# Patient Record
Sex: Female | Born: 1945
Health system: Southern US, Community
[De-identification: ages and names within clinical notes are randomized; demographics above are authoritative.]

## PROBLEM LIST (undated history)

## (undated) DIAGNOSIS — F429 Obsessive-compulsive disorder, unspecified: Secondary | ICD-10-CM

## (undated) DIAGNOSIS — E785 Hyperlipidemia, unspecified: Secondary | ICD-10-CM

## (undated) DIAGNOSIS — C439 Malignant melanoma of skin, unspecified: Secondary | ICD-10-CM

## (undated) DIAGNOSIS — C801 Malignant (primary) neoplasm, unspecified: Secondary | ICD-10-CM

## (undated) DIAGNOSIS — F419 Anxiety disorder, unspecified: Secondary | ICD-10-CM

## (undated) DIAGNOSIS — E119 Type 2 diabetes mellitus without complications: Secondary | ICD-10-CM

## (undated) HISTORY — PX: EYE SURGERY: SHX253

## (undated) HISTORY — DX: Hyperlipidemia, unspecified: E78.5

## (undated) HISTORY — DX: Obsessive-compulsive disorder, unspecified: F42.9

## (undated) HISTORY — DX: Anxiety disorder, unspecified: F41.9

## (undated) HISTORY — DX: Malignant melanoma of skin, unspecified: C43.9

## (undated) HISTORY — DX: Type 2 diabetes mellitus without complications: E11.9

## (undated) HISTORY — PX: ABDOMINAL HYSTERECTOMY: SHX81

## (undated) HISTORY — DX: Malignant (primary) neoplasm, unspecified: C80.1

## (undated) HISTORY — PX: CATARACT EXTRACTION: SUR2

---

## 1998-05-21 ENCOUNTER — Ambulatory Visit (HOSPITAL_COMMUNITY): Admission: RE | Admit: 1998-05-21 | Discharge: 1998-05-21 | Payer: Self-pay | Admitting: Internal Medicine

## 1998-06-19 ENCOUNTER — Other Ambulatory Visit: Admission: RE | Admit: 1998-06-19 | Discharge: 1998-06-19 | Payer: Self-pay | Admitting: Gastroenterology

## 2000-02-16 ENCOUNTER — Encounter: Admission: RE | Admit: 2000-02-16 | Discharge: 2000-02-16 | Payer: Self-pay | Admitting: Internal Medicine

## 2000-02-16 ENCOUNTER — Encounter: Payer: Self-pay | Admitting: Internal Medicine

## 2000-03-26 ENCOUNTER — Encounter: Payer: Self-pay | Admitting: Internal Medicine

## 2000-03-26 ENCOUNTER — Encounter: Admission: RE | Admit: 2000-03-26 | Discharge: 2000-03-26 | Payer: Self-pay | Admitting: Internal Medicine

## 2001-02-17 ENCOUNTER — Encounter: Payer: Self-pay | Admitting: Internal Medicine

## 2001-02-17 ENCOUNTER — Encounter: Admission: RE | Admit: 2001-02-17 | Discharge: 2001-02-17 | Payer: Self-pay | Admitting: Internal Medicine

## 2001-02-21 ENCOUNTER — Encounter: Payer: Self-pay | Admitting: Internal Medicine

## 2001-02-21 ENCOUNTER — Encounter: Admission: RE | Admit: 2001-02-21 | Discharge: 2001-02-21 | Payer: Self-pay | Admitting: Internal Medicine

## 2002-02-20 ENCOUNTER — Encounter: Payer: Self-pay | Admitting: Internal Medicine

## 2002-02-20 ENCOUNTER — Encounter: Admission: RE | Admit: 2002-02-20 | Discharge: 2002-02-20 | Payer: Self-pay | Admitting: Internal Medicine

## 2003-02-23 ENCOUNTER — Encounter: Admission: RE | Admit: 2003-02-23 | Discharge: 2003-02-23 | Payer: Self-pay | Admitting: Internal Medicine

## 2003-02-23 ENCOUNTER — Encounter: Payer: Self-pay | Admitting: Internal Medicine

## 2004-01-22 ENCOUNTER — Encounter: Admission: RE | Admit: 2004-01-22 | Discharge: 2004-01-22 | Payer: Self-pay | Admitting: Internal Medicine

## 2005-01-22 ENCOUNTER — Encounter: Admission: RE | Admit: 2005-01-22 | Discharge: 2005-01-22 | Payer: Self-pay | Admitting: Internal Medicine

## 2006-01-14 ENCOUNTER — Encounter: Admission: RE | Admit: 2006-01-14 | Discharge: 2006-01-14 | Payer: Self-pay | Admitting: Internal Medicine

## 2007-01-24 ENCOUNTER — Encounter: Admission: RE | Admit: 2007-01-24 | Discharge: 2007-01-24 | Payer: Self-pay | Admitting: Internal Medicine

## 2008-01-24 ENCOUNTER — Encounter: Admission: RE | Admit: 2008-01-24 | Discharge: 2008-01-24 | Payer: Self-pay | Admitting: Internal Medicine

## 2009-01-24 ENCOUNTER — Encounter: Admission: RE | Admit: 2009-01-24 | Discharge: 2009-01-24 | Payer: Self-pay | Admitting: Internal Medicine

## 2010-01-24 ENCOUNTER — Encounter: Admission: RE | Admit: 2010-01-24 | Discharge: 2010-01-24 | Payer: Self-pay | Admitting: Internal Medicine

## 2010-02-25 ENCOUNTER — Encounter (INDEPENDENT_AMBULATORY_CARE_PROVIDER_SITE_OTHER): Payer: Self-pay | Admitting: *Deleted

## 2010-02-28 ENCOUNTER — Encounter (INDEPENDENT_AMBULATORY_CARE_PROVIDER_SITE_OTHER): Payer: Self-pay | Admitting: *Deleted

## 2010-02-28 ENCOUNTER — Ambulatory Visit: Payer: Self-pay | Admitting: Gastroenterology

## 2010-03-18 ENCOUNTER — Ambulatory Visit: Payer: Self-pay | Admitting: Gastroenterology

## 2010-03-19 ENCOUNTER — Encounter: Payer: Self-pay | Admitting: Gastroenterology

## 2010-11-11 NOTE — Letter (Signed)
Summary: [facility] Instructions  [facility]  [address] [place], [place] [address]   Phone: [phone]  Fax: [phone]       Diane Huff    Dec 02, 1945    MRN: [id number]        Procedure Day Dorna Bloom:  [name]  03/18/10     Arrival Time: 8:00am     Procedure Time:  9:00am    Location of Procedure:                    [name]  [facility] (4th Floor)                       PREPARATION FOR COLONOSCOPY WITH MOVIPREP   Starting 5 days prior to your procedure  THURSDAY 06/02  do not eat nuts, seeds, popcorn, corn, beans, peas,  salads, or any raw vegetables.  Do not take any fiber supplements (e.g. Metamucil, Citrucel, and Benefiber).  THE DAY BEFORE YOUR PROCEDURE         DATE: MONDAY  06/06  1.  Drink clear liquids the entire day-NO SOLID FOOD  2.  Do not drink anything colored red or purple.  Avoid juices with pulp.  No orange juice.  3.  Drink at least 64 oz. (8 glasses) of fluid/clear liquids during the day to prevent dehydration and help the prep work efficiently.  CLEAR LIQUIDS INCLUDE: Water Jello Ice Popsicles Tea (sugar ok, no milk/cream) Powdered fruit flavored drinks Coffee (sugar ok, no milk/cream) Gatorade Juice: apple, white grape, white cranberry  Lemonade Clear bullion, consomm, broth Carbonated beverages (any kind) Strained chicken noodle soup Hard Candy                             4.  In the morning, mix first dose of MoviPrep solution:    Empty 1 Pouch A and 1 Pouch B into the disposable container    Add lukewarm drinking water to the top line of the container. Mix to dissolve    Refrigerate (mixed solution should be used within 24 hrs)  5.  Begin drinking the prep at 5:00 p.m. The MoviPrep container is divided by 4 marks.   Every 15 minutes drink the solution down to the next mark (approximately 8 oz) until the full liter is complete.   6.  Follow completed prep with 16 oz of clear liquid of your choice  (Nothing red or purple).  Continue to drink clear liquids until bedtime.  7.  Before going to bed, mix second dose of MoviPrep solution:    Empty 1 Pouch A and 1 Pouch B into the disposable container    Add lukewarm drinking water to the top line of the container. Mix to dissolve    Refrigerate  THE DAY OF YOUR PROCEDURE      DATE: TUESDAY  06/07  Beginning at  4:00 a.m. (5 hours before procedure):         1. Every 15 minutes, drink the solution down to the next mark (approx 8 oz) until the full liter is complete.  2. Follow completed prep with 16 oz. of clear liquid of your choice.    3. You may drink clear liquids until  7:00am (2 HOURS BEFORE PROCEDURE).   MEDICATION INSTRUCTIONS  Unless otherwise instructed, you should take regular prescription medications with a small sip of water   as early as possible the  morning of your procedure.  Diabetic patients - see separate instructions.   Additional medication instructions: n/a         OTHER INSTRUCTIONS  You will need a responsible adult at least 66 years of age to accompany you and drive you home.   This person must remain in the waiting room during your procedure.  Wear loose fitting clothing that is easily removed.  Leave jewelry and other valuables at home.  However, you may wish to bring a book to read or  an iPod/MP3 player to listen to music as you wait for your procedure to start.  Remove all body piercing jewelry and leave at home.  Total time from sign-in until discharge is approximately 2-3 hours.  You should go home directly after your procedure and rest.  You can resume normal activities the  day after your procedure.  The day of your procedure you should not:   Drive   Make legal decisions   Operate machinery   Drink alcohol   Return to work  You will receive specific instructions about eating, activities and medications before you leave.    The above instructions have been reviewed  and explained to me by   Sherren Kerns RN  Feb 28, 2010 8:28 AM    I fully understand and can verbalize these instructions _____________________________ Date _________

## 2010-11-11 NOTE — Letter (Signed)
Summary: Diabetic Instructions  [facility]  [address] [place], [place] [address]   Phone: [phone]  Fax: [phone]    Diane Huff 1946/02/08 MRN: [id number]   _x _   ORAL DIABETIC MEDICATION INSTRUCTIONS  The day before your procedure:   Take your diabetic pill as you do normally  The day of your procedure:   Do not take your diabetic pill    We will check your blood sugar levels during the admission process and again in Recovery before discharging you home  ________________________________________________________________________

## 2010-11-11 NOTE — Procedures (Signed)
Summary: Colonoscopy  Patient: Diane Huff Note: All result statuses are Final unless otherwise noted.  Tests: (1) Colonoscopy (COL)   COL Colonoscopy           DONE     [facility]     [address]     [place], [place]  [address]           COLONOSCOPY PROCEDURE REPORT           PATIENT:  [name], [name]  MR#:  [id number]     BIRTHDATE:  [birth year], 64 yrs. old  GENDER:  female     ENDOSCOPIST:  Judie Petit T. Russella Dar, MD, [facility]           PROCEDURE DATE:  03/18/2010     PROCEDURE:  Colonoscopy with snare polypectomy     ASA CLASS:  Class II     INDICATIONS:  1) follow-up of polyp  2) surveillance and high-risk     screening, adenomatous polyp, 06/1998 and 04/2002.     MEDICATIONS:   Fentanyl 75 mcg IV, Versed 7 mg IV     DESCRIPTION OF PROCEDURE:   After the risks benefits and     alternatives of the procedure were thoroughly explained, informed     consent was obtained.  Digital rectal exam was performed and     revealed no abnormalities.   The LB [id number]-[id number] endoscope     was introduced through the anus and advanced to the cecum, which     was identified by both the appendix and ileocecal valve, without     limitations.  The quality of the prep was good, using MoviPrep.     The instrument was then slowly withdrawn as the colon was fully     examined.     <<PROCEDUREIMAGES>>     FINDINGS:  A sessile polyp was found at the hepatic flexure. It     was 5 mm in size. Polyp was snared without cautery. Retrieval was     successful. A sessile polyp was found in the descending colon. It     was 5 mm in size. Polyp was snared without cautery. Retrieval was     successful. A sessile polyp was found in the right colon. It was 6     mm in size. Polyp was snared without cautery. Retrieval was     successful. Mild diverticulosis was found in the sigmoid colon.     This was otherwise a normal examination of the colon. Retroflexed     views in the rectum revealed no  abnormalities. The time to cecum =     2.33  minutes. The scope was then withdrawn (time =  8.75  min)     from the patient and the procedure completed.           COMPLICATIONS:  None           ENDOSCOPIC IMPRESSION:     1) 5 mm sessile polyp at the hepatic flexure     2) 5 mm sessile polyp in the descending colon     3) 6 mm sessile polyp in the right colon     4) Mild diverticulosis in the sigmoid colon           RECOMMENDATIONS:     1) Await pathology results     2) High fiber diet with liberal fluid intake.     3) Repeat Colonoscopy in 5 years.  Venita Lick. Russella Dar, MD, [facility]           CC: Nila Nephew, MD           n.     Rosalie DoctorVenita Lick. Shantika Bermea at 03/18/2010 09:35 AM           Arn Medal, [id number]  Note: An exclamation mark (!) indicates a result that was not dispersed into the flowsheet. Document Creation Date: 03/18/2010 9:36 AM _______________________________________________________________________  (1) Order result status: Final Collection or observation date-time: 03/18/2010 09:31 Requested date-time:  Receipt date-time:  Reported date-time:  Referring Physician:   Ordering Physician: Claudette Head [phone]) Specimen Source:  Source: Launa Grill Order Number: [id number] Lab site:   Appended Document: Colonoscopy     Procedures Next Due Date:    Colonoscopy: 03/2015

## 2010-11-11 NOTE — Letter (Signed)
Summary: Patient Notice- Polyp Results  [facility]  [address] [place], [place] [address]   Phone: [phone]  Fax: [phone]        March 19, 2010 MRN: [id number]    Diane Huff [address] [place], [place]  [address]    Dear Ms. Pica,  I am pleased to inform you that the colon polyp(s) removed during your recent colonoscopy was (were) found to be benign (no cancer detected) upon pathologic examination.  I recommend you have a repeat colonoscopy examination in 5 years to look for recurrent polyps, as having colon polyps increases your risk for having recurrent polyps or even colon cancer in the future.  Should you develop new or worsening symptoms of abdominal pain, bowel habit changes or bleeding from the rectum or bowels, please schedule an evaluation with either your primary care physician or with me.  Continue treatment plan as outlined the day of your exam.  Please call us if you are having persistent problems or have questions about your condition that have not been fully answered at this time.  Sincerely,  Meryl Dare MD [facility]  This letter has been electronically signed by your physician.  Appended Document: Patient Notice- Polyp Results letter mailed.

## 2010-11-11 NOTE — Miscellaneous (Signed)
Summary: previsit/rm  Clinical Lists Changes  Medications: Added new medication of MOVIPREP 100 GM  SOLR (PEG-KCL-NACL-NASULF-NA ASC-C) As per prep instructions. - Signed Rx of MOVIPREP 100 GM  SOLR (PEG-KCL-NACL-NASULF-NA ASC-C) As per prep instructions.;  #1 x 0;  Signed;  Entered by: Sherren Kerns RN;  Authorized by: Meryl Dare MD [facility];  Method used: Electronically to [facility] # [id number]*, [address] [place], [place], [place]  [address], Ph: [phone] or [phone], Fax: [phone] Allergies: Added new allergy or adverse reaction of SULFA Observations: Added new observation of NKA: F (02/28/2010 8:01)    Prescriptions: MOVIPREP 100 GM  SOLR (PEG-KCL-NACL-NASULF-NA ASC-C) As per prep instructions.  #1 x 0   Entered by:   Sherren Kerns RN   Authorized by:   Meryl Dare MD [facility]   Signed by:   Sherren Kerns RN on 02/28/2010   Method used:   Electronically to        [facility] # [id number]* (retail)       [address]       [place], [place]  [address]       Ph: [phone] or [phone]       Fax: [phone]   RxID:   [id number]

## 2010-11-27 ENCOUNTER — Other Ambulatory Visit: Payer: Self-pay | Admitting: Internal Medicine

## 2010-11-27 DIAGNOSIS — Z1231 Encounter for screening mammogram for malignant neoplasm of breast: Secondary | ICD-10-CM

## 2010-12-29 LAB — GLUCOSE, CAPILLARY

## 2011-01-27 ENCOUNTER — Ambulatory Visit
Admission: RE | Admit: 2011-01-27 | Discharge: 2011-01-27 | Disposition: A | Discharge status: Home / Self Care | Payer: Medicare Other | Source: Ambulatory Visit | Attending: Internal Medicine | Admitting: Internal Medicine

## 2011-01-27 DIAGNOSIS — Z1231 Encounter for screening mammogram for malignant neoplasm of breast: Secondary | ICD-10-CM

## 2011-12-22 ENCOUNTER — Other Ambulatory Visit: Payer: Self-pay | Admitting: Internal Medicine

## 2011-12-22 DIAGNOSIS — Z1231 Encounter for screening mammogram for malignant neoplasm of breast: Secondary | ICD-10-CM

## 2011-12-31 DIAGNOSIS — B353 Tinea pedis: Secondary | ICD-10-CM | POA: Diagnosis not present

## 2012-01-28 ENCOUNTER — Ambulatory Visit: Payer: Medicare Other

## 2012-01-28 ENCOUNTER — Ambulatory Visit
Admission: RE | Admit: 2012-01-28 | Discharge: 2012-01-28 | Disposition: A | Discharge status: Home / Self Care | Payer: Medicare Other | Source: Ambulatory Visit | Attending: Internal Medicine | Admitting: Internal Medicine

## 2012-01-28 DIAGNOSIS — Z1231 Encounter for screening mammogram for malignant neoplasm of breast: Secondary | ICD-10-CM

## 2012-02-05 ENCOUNTER — Ambulatory Visit: Payer: Medicare Other

## 2012-02-05 DIAGNOSIS — E039 Hypothyroidism, unspecified: Secondary | ICD-10-CM | POA: Diagnosis not present

## 2012-02-05 DIAGNOSIS — Z79899 Other long term (current) drug therapy: Secondary | ICD-10-CM | POA: Diagnosis not present

## 2012-02-05 DIAGNOSIS — E119 Type 2 diabetes mellitus without complications: Secondary | ICD-10-CM | POA: Diagnosis not present

## 2012-02-05 DIAGNOSIS — D045 Carcinoma in situ of skin of trunk: Secondary | ICD-10-CM | POA: Diagnosis not present

## 2012-02-05 DIAGNOSIS — Z0000 Encounter for general adult medical examination without abnormal findings: Secondary | ICD-10-CM | POA: Diagnosis not present

## 2012-02-10 ENCOUNTER — Other Ambulatory Visit: Payer: Self-pay | Admitting: Internal Medicine

## 2012-02-10 DIAGNOSIS — N959 Unspecified menopausal and perimenopausal disorder: Secondary | ICD-10-CM

## 2012-02-11 DIAGNOSIS — H35379 Puckering of macula, unspecified eye: Secondary | ICD-10-CM | POA: Diagnosis not present

## 2012-02-11 DIAGNOSIS — Z961 Presence of intraocular lens: Secondary | ICD-10-CM | POA: Diagnosis not present

## 2012-02-11 DIAGNOSIS — E119 Type 2 diabetes mellitus without complications: Secondary | ICD-10-CM | POA: Diagnosis not present

## 2012-02-11 DIAGNOSIS — H2510 Age-related nuclear cataract, unspecified eye: Secondary | ICD-10-CM | POA: Diagnosis not present

## 2012-02-11 DIAGNOSIS — C6930 Malignant neoplasm of unspecified choroid: Secondary | ICD-10-CM | POA: Diagnosis not present

## 2012-02-16 ENCOUNTER — Ambulatory Visit
Admission: RE | Admit: 2012-02-16 | Discharge: 2012-02-16 | Disposition: A | Discharge status: Home / Self Care | Payer: Medicare Other | Source: Ambulatory Visit | Attending: Internal Medicine | Admitting: Internal Medicine

## 2012-02-16 DIAGNOSIS — Z780 Asymptomatic menopausal state: Secondary | ICD-10-CM | POA: Diagnosis not present

## 2012-02-16 DIAGNOSIS — N959 Unspecified menopausal and perimenopausal disorder: Secondary | ICD-10-CM

## 2012-02-16 DIAGNOSIS — Z13820 Encounter for screening for osteoporosis: Secondary | ICD-10-CM | POA: Diagnosis not present

## 2012-02-18 ENCOUNTER — Other Ambulatory Visit: Payer: Medicare Other

## 2012-04-15 DIAGNOSIS — R234 Changes in skin texture: Secondary | ICD-10-CM | POA: Diagnosis not present

## 2012-04-15 DIAGNOSIS — M7981 Nontraumatic hematoma of soft tissue: Secondary | ICD-10-CM | POA: Diagnosis not present

## 2012-05-06 DIAGNOSIS — E119 Type 2 diabetes mellitus without complications: Secondary | ICD-10-CM | POA: Diagnosis not present

## 2012-05-06 DIAGNOSIS — F411 Generalized anxiety disorder: Secondary | ICD-10-CM | POA: Diagnosis not present

## 2012-07-14 DIAGNOSIS — E119 Type 2 diabetes mellitus without complications: Secondary | ICD-10-CM | POA: Diagnosis not present

## 2012-07-14 DIAGNOSIS — Z23 Encounter for immunization: Secondary | ICD-10-CM | POA: Diagnosis not present

## 2012-07-14 DIAGNOSIS — F411 Generalized anxiety disorder: Secondary | ICD-10-CM | POA: Diagnosis not present

## 2012-08-10 DIAGNOSIS — Z9849 Cataract extraction status, unspecified eye: Secondary | ICD-10-CM | POA: Diagnosis not present

## 2012-08-10 DIAGNOSIS — E119 Type 2 diabetes mellitus without complications: Secondary | ICD-10-CM | POA: Diagnosis not present

## 2012-08-10 DIAGNOSIS — H2640 Unspecified secondary cataract: Secondary | ICD-10-CM | POA: Diagnosis not present

## 2012-08-10 DIAGNOSIS — Z961 Presence of intraocular lens: Secondary | ICD-10-CM | POA: Diagnosis not present

## 2012-08-10 DIAGNOSIS — C6930 Malignant neoplasm of unspecified choroid: Secondary | ICD-10-CM | POA: Diagnosis not present

## 2012-08-10 DIAGNOSIS — H35379 Puckering of macula, unspecified eye: Secondary | ICD-10-CM | POA: Diagnosis not present

## 2012-08-10 DIAGNOSIS — H2589 Other age-related cataract: Secondary | ICD-10-CM | POA: Diagnosis not present

## 2012-08-15 DIAGNOSIS — F411 Generalized anxiety disorder: Secondary | ICD-10-CM | POA: Diagnosis not present

## 2012-09-14 DIAGNOSIS — L570 Actinic keratosis: Secondary | ICD-10-CM | POA: Diagnosis not present

## 2012-11-16 ENCOUNTER — Other Ambulatory Visit: Payer: Self-pay | Admitting: Internal Medicine

## 2012-11-16 DIAGNOSIS — Z1231 Encounter for screening mammogram for malignant neoplasm of breast: Secondary | ICD-10-CM

## 2013-01-12 DIAGNOSIS — C439 Malignant melanoma of skin, unspecified: Secondary | ICD-10-CM | POA: Diagnosis not present

## 2013-01-13 DIAGNOSIS — Z961 Presence of intraocular lens: Secondary | ICD-10-CM | POA: Diagnosis not present

## 2013-01-13 DIAGNOSIS — E119 Type 2 diabetes mellitus without complications: Secondary | ICD-10-CM | POA: Diagnosis not present

## 2013-01-13 DIAGNOSIS — C6930 Malignant neoplasm of unspecified choroid: Secondary | ICD-10-CM | POA: Diagnosis not present

## 2013-01-13 DIAGNOSIS — H35379 Puckering of macula, unspecified eye: Secondary | ICD-10-CM | POA: Diagnosis not present

## 2013-01-13 DIAGNOSIS — H2510 Age-related nuclear cataract, unspecified eye: Secondary | ICD-10-CM | POA: Diagnosis not present

## 2013-01-30 ENCOUNTER — Ambulatory Visit: Payer: Medicare Other

## 2013-01-30 ENCOUNTER — Ambulatory Visit
Admission: RE | Admit: 2013-01-30 | Discharge: 2013-01-30 | Disposition: A | Discharge status: Home / Self Care | Payer: Medicare Other | Source: Ambulatory Visit | Attending: Internal Medicine | Admitting: Internal Medicine

## 2013-01-30 DIAGNOSIS — Z1231 Encounter for screening mammogram for malignant neoplasm of breast: Secondary | ICD-10-CM

## 2013-02-16 DIAGNOSIS — E119 Type 2 diabetes mellitus without complications: Secondary | ICD-10-CM | POA: Diagnosis not present

## 2013-02-16 DIAGNOSIS — I10 Essential (primary) hypertension: Secondary | ICD-10-CM | POA: Diagnosis not present

## 2013-02-16 DIAGNOSIS — Z0000 Encounter for general adult medical examination without abnormal findings: Secondary | ICD-10-CM | POA: Diagnosis not present

## 2013-02-16 DIAGNOSIS — E7800 Pure hypercholesterolemia, unspecified: Secondary | ICD-10-CM | POA: Diagnosis not present

## 2013-02-16 DIAGNOSIS — E039 Hypothyroidism, unspecified: Secondary | ICD-10-CM | POA: Diagnosis not present

## 2013-03-30 DIAGNOSIS — F411 Generalized anxiety disorder: Secondary | ICD-10-CM | POA: Diagnosis not present

## 2013-04-06 DIAGNOSIS — F411 Generalized anxiety disorder: Secondary | ICD-10-CM | POA: Diagnosis not present

## 2013-04-06 DIAGNOSIS — L509 Urticaria, unspecified: Secondary | ICD-10-CM | POA: Diagnosis not present

## 2013-04-10 DIAGNOSIS — F411 Generalized anxiety disorder: Secondary | ICD-10-CM | POA: Diagnosis not present

## 2013-04-10 DIAGNOSIS — L509 Urticaria, unspecified: Secondary | ICD-10-CM | POA: Diagnosis not present

## 2013-04-18 DIAGNOSIS — E119 Type 2 diabetes mellitus without complications: Secondary | ICD-10-CM | POA: Diagnosis not present

## 2013-04-18 DIAGNOSIS — F329 Major depressive disorder, single episode, unspecified: Secondary | ICD-10-CM | POA: Diagnosis not present

## 2013-04-20 DIAGNOSIS — F411 Generalized anxiety disorder: Secondary | ICD-10-CM | POA: Diagnosis not present

## 2013-04-25 DIAGNOSIS — F411 Generalized anxiety disorder: Secondary | ICD-10-CM | POA: Diagnosis not present

## 2013-05-15 DIAGNOSIS — D1739 Benign lipomatous neoplasm of skin and subcutaneous tissue of other sites: Secondary | ICD-10-CM | POA: Diagnosis not present

## 2013-05-17 DIAGNOSIS — F411 Generalized anxiety disorder: Secondary | ICD-10-CM | POA: Diagnosis not present

## 2013-05-25 DIAGNOSIS — F411 Generalized anxiety disorder: Secondary | ICD-10-CM | POA: Diagnosis not present

## 2013-06-21 DIAGNOSIS — F411 Generalized anxiety disorder: Secondary | ICD-10-CM | POA: Diagnosis not present

## 2013-07-03 ENCOUNTER — Other Ambulatory Visit: Payer: Self-pay | Admitting: Dermatology

## 2013-07-03 DIAGNOSIS — Z85820 Personal history of malignant melanoma of skin: Secondary | ICD-10-CM | POA: Diagnosis not present

## 2013-07-03 DIAGNOSIS — L723 Sebaceous cyst: Secondary | ICD-10-CM | POA: Diagnosis not present

## 2013-07-06 DIAGNOSIS — F411 Generalized anxiety disorder: Secondary | ICD-10-CM | POA: Diagnosis not present

## 2013-07-19 DIAGNOSIS — F411 Generalized anxiety disorder: Secondary | ICD-10-CM | POA: Diagnosis not present

## 2013-08-01 DIAGNOSIS — F411 Generalized anxiety disorder: Secondary | ICD-10-CM | POA: Diagnosis not present

## 2013-08-10 DIAGNOSIS — E119 Type 2 diabetes mellitus without complications: Secondary | ICD-10-CM | POA: Diagnosis not present

## 2013-08-10 DIAGNOSIS — F411 Generalized anxiety disorder: Secondary | ICD-10-CM | POA: Diagnosis not present

## 2013-08-10 DIAGNOSIS — Z23 Encounter for immunization: Secondary | ICD-10-CM | POA: Diagnosis not present

## 2013-08-16 DIAGNOSIS — C6930 Malignant neoplasm of unspecified choroid: Secondary | ICD-10-CM | POA: Diagnosis not present

## 2013-08-16 DIAGNOSIS — Z961 Presence of intraocular lens: Secondary | ICD-10-CM | POA: Diagnosis not present

## 2013-08-16 DIAGNOSIS — H2589 Other age-related cataract: Secondary | ICD-10-CM | POA: Diagnosis not present

## 2013-08-16 DIAGNOSIS — H35379 Puckering of macula, unspecified eye: Secondary | ICD-10-CM | POA: Diagnosis not present

## 2013-08-16 DIAGNOSIS — E119 Type 2 diabetes mellitus without complications: Secondary | ICD-10-CM | POA: Diagnosis not present

## 2013-09-12 DIAGNOSIS — F411 Generalized anxiety disorder: Secondary | ICD-10-CM | POA: Diagnosis not present

## 2013-11-21 DIAGNOSIS — IMO0002 Reserved for concepts with insufficient information to code with codable children: Secondary | ICD-10-CM | POA: Diagnosis not present

## 2013-12-12 ENCOUNTER — Other Ambulatory Visit: Payer: Self-pay

## 2013-12-12 DIAGNOSIS — Z1231 Encounter for screening mammogram for malignant neoplasm of breast: Secondary | ICD-10-CM

## 2013-12-12 DIAGNOSIS — F411 Generalized anxiety disorder: Secondary | ICD-10-CM | POA: Diagnosis not present

## 2014-01-04 DIAGNOSIS — F411 Generalized anxiety disorder: Secondary | ICD-10-CM | POA: Diagnosis not present

## 2014-01-16 DIAGNOSIS — Z961 Presence of intraocular lens: Secondary | ICD-10-CM | POA: Diagnosis not present

## 2014-01-16 DIAGNOSIS — H2510 Age-related nuclear cataract, unspecified eye: Secondary | ICD-10-CM | POA: Diagnosis not present

## 2014-01-16 DIAGNOSIS — C6930 Malignant neoplasm of unspecified choroid: Secondary | ICD-10-CM | POA: Diagnosis not present

## 2014-01-16 DIAGNOSIS — H35379 Puckering of macula, unspecified eye: Secondary | ICD-10-CM | POA: Diagnosis not present

## 2014-01-30 ENCOUNTER — Encounter (INDEPENDENT_AMBULATORY_CARE_PROVIDER_SITE_OTHER): Payer: Self-pay

## 2014-01-30 ENCOUNTER — Ambulatory Visit
Admission: RE | Admit: 2014-01-30 | Discharge: 2014-01-30 | Disposition: A | Discharge status: Home / Self Care | Payer: Medicare Other | Source: Ambulatory Visit

## 2014-01-30 DIAGNOSIS — Z1231 Encounter for screening mammogram for malignant neoplasm of breast: Secondary | ICD-10-CM | POA: Diagnosis not present

## 2014-02-13 DIAGNOSIS — E119 Type 2 diabetes mellitus without complications: Secondary | ICD-10-CM | POA: Diagnosis not present

## 2014-02-13 DIAGNOSIS — Z79899 Other long term (current) drug therapy: Secondary | ICD-10-CM | POA: Diagnosis not present

## 2014-02-13 DIAGNOSIS — I119 Hypertensive heart disease without heart failure: Secondary | ICD-10-CM | POA: Diagnosis not present

## 2014-02-13 DIAGNOSIS — E039 Hypothyroidism, unspecified: Secondary | ICD-10-CM | POA: Diagnosis not present

## 2014-02-13 DIAGNOSIS — E7800 Pure hypercholesterolemia, unspecified: Secondary | ICD-10-CM | POA: Diagnosis not present

## 2014-02-16 ENCOUNTER — Ambulatory Visit (INDEPENDENT_AMBULATORY_CARE_PROVIDER_SITE_OTHER): Payer: Medicare Other | Admitting: Family Medicine

## 2014-02-16 ENCOUNTER — Ambulatory Visit: Payer: Medicare Other

## 2014-02-16 VITALS — BP 124/72 | HR 89 | Temp 98.0°F | Resp 14 | Ht 64.5 in | Wt 148.4 lb

## 2014-02-16 DIAGNOSIS — S40029A Contusion of unspecified upper arm, initial encounter: Secondary | ICD-10-CM

## 2014-02-16 DIAGNOSIS — I82890 Acute embolism and thrombosis of other specified veins: Secondary | ICD-10-CM | POA: Diagnosis not present

## 2014-02-16 DIAGNOSIS — S40021A Contusion of right upper arm, initial encounter: Secondary | ICD-10-CM

## 2014-02-16 DIAGNOSIS — R58 Hemorrhage, not elsewhere classified: Secondary | ICD-10-CM

## 2014-02-16 NOTE — Patient Instructions (Signed)
No broken bone was seen.  Ice to area if needed. Tylenol if needed for pain.  Return to the clinic or go to the nearest emergency room if any of your symptoms worsen or new symptoms occur. Contusion A contusion is a deep bruise. Contusions are the result of an injury that caused bleeding under the skin. The contusion may turn blue, purple, or yellow. Minor injuries will give you a painless contusion, but more severe contusions may stay painful and swollen for a few weeks.  CAUSES  A contusion is usually caused by a blow, trauma, or direct force to an area of the body. SYMPTOMS   Swelling and redness of the injured area.  Bruising of the injured area.  Tenderness and soreness of the injured area.  Pain. DIAGNOSIS  The diagnosis can be made by taking a history and physical exam. An X-ray, CT scan, or MRI may be needed to determine if there were any associated injuries, such as fractures. TREATMENT  Specific treatment will depend on what area of the body was injured. In general, the best treatment for a contusion is resting, icing, elevating, and applying cold compresses to the injured area. Over-the-counter medicines may also be recommended for pain control. Ask your caregiver what the best treatment is for your contusion. HOME CARE INSTRUCTIONS   Put ice on the injured area.  Put ice in a plastic bag.  Place a towel between your skin and the bag.  Leave the ice on for 15-20 minutes, 03-04 times a day.  Only take over-the-counter or prescription medicines for pain, discomfort, or fever as directed by your caregiver. Your caregiver may recommend avoiding anti-inflammatory medicines (aspirin, ibuprofen, and naproxen) for 48 hours because these medicines may increase bruising.  Rest the injured area.  If possible, elevate the injured area to reduce swelling. SEEK IMMEDIATE MEDICAL CARE IF:   You have increased bruising or swelling.  You have pain that is getting worse.  Your  swelling or pain is not relieved with medicines. MAKE SURE YOU:   Understand these instructions.  Will watch your condition.  Will get help right away if you are not doing well or get worse. Document Released: 07/08/2005 Document Revised: 12/21/2011 Document Reviewed: 08/03/2011 [facility] Patient Information 2014 [place], [place].

## 2014-02-16 NOTE — Progress Notes (Signed)
Subjective:  This chart was scribed for [name], MD by Ludger Nutting, ED Scribe. This patient was seen in room 9 and the patient's care was started 1:31 PM.    Patient ID: Diane Huff, female    DOB: Apr 30, 1946, 68 y.o.   MRN: [id number]  HPI HPI Comments: Diane Huff is a 68 y.o. female who presents to [facility] complaining of a fall that occurred yesterday. She states she was walking up concrete steps to enter her house when she tripped. She reports striking the lateral side of her RUE on a wooden surface. She has a bruise to the RUE and an abrasion to the right elbow. She denies numbness, weakness.  There are no active problems to display for this patient.  Past Medical History  Diagnosis Date  . Anxiety   . Cancer   . Diabetes mellitus without complication    Past Surgical History  Procedure Laterality Date  . Abdominal hysterectomy    . Eye surgery     Allergies  Allergen Reactions  . Sulfonamide Derivatives     REACTION: hands swell,itch   Prior to Admission medications   Medication Sig Start Date End Date Taking? Authorizing Provider  calcium carbonate (OS-CAL) 600 MG TABS tablet Take 600 mg by mouth 2 (two) times daily with a meal.   Yes Historical Provider, MD  clonazePAM (KLONOPIN) 0.5 MG tablet Take 0.5 mg by mouth daily.   Yes Historical Provider, MD  fluvoxaMINE (LUVOX) 100 MG tablet Take 100 mg by mouth at bedtime.   Yes Historical Provider, MD  levothyroxine (SYNTHROID, LEVOTHROID) 50 MCG tablet Take 50 mcg by mouth daily before breakfast.   Yes Historical Provider, MD  metFORMIN (GLUCOPHAGE) 850 MG tablet Take 850 mg by mouth 2 (two) times daily with a meal.   Yes Historical Provider, MD  Multiple Vitamin (MULTIVITAMIN) capsule Take 1 capsule by mouth daily.   Yes Historical Provider, MD  pseudoephedrine-acetaminophen (TYLENOL SINUS) 30-500 MG TABS Take 1 tablet by mouth every 4 (four) hours as needed.   Yes Historical Provider, MD  sertraline  (ZOLOFT) 100 MG tablet Take 100 mg by mouth daily.   Yes Historical Provider, MD  simvastatin (ZOCOR) 20 MG tablet Take 20 mg by mouth daily.   Yes Historical Provider, MD   History   Social History  . Marital Status: Married    Spouse Name: N/A    Number of Children: N/A  . Years of Education: N/A   Occupational History  . Not on file.   Social History Main Topics  . Smoking status: Never Smoker   . Smokeless tobacco: Not on file  . Alcohol Use: No  . Drug Use: No  . Sexual Activity: Not on file   Other Topics Concern  . Not on file   Social History Narrative  . No narrative on file     Review of Systems  Musculoskeletal: Positive for arthralgias (RUE). Negative for back pain and neck pain.  Neurological: Negative for weakness and numbness.       Objective:   Physical Exam  Nursing note and vitals reviewed. Constitutional: She is oriented to person, place, and time. She appears well-developed and well-nourished.  HENT:  Head: Normocephalic and atraumatic.  Eyes: EOM are normal.  Neck: Normal range of motion.  Cardiovascular: Normal rate.   Pulmonary/Chest: Effort normal.  Abdominal: She exhibits no distension.  Musculoskeletal: Normal range of motion.  RUE:  [place] and AC joints as well as  clavicle are non tender.  Right Elbow: has full ROM. Radial head is non tender. Small superfical abrasion over lateral elbow.  Wrist full ROM, non tender.  Full rotator cuff strength and full ROM.  Diffuse ecchymosis on the upper outer arm. Small abrasion noted.  No discrete bony tenderness.   Neurological: She is alert and oriented to person, place, and time.  NVI distally. Strength intact throughout arm including biceps and triceps testing.   Skin: Skin is warm and dry.  Psychiatric: She has a normal mood and affect.     Filed Vitals:   02/16/14 1150  BP: 124/72  Pulse: 89  Temp: 98 F (36.7 C)  TempSrc: Oral  Resp: 14  Height: 5' 4.5" (1.638 m)  Weight: 148 lb  6.4 oz (67.314 kg)  SpO2: 99%    [facility] reading (PRIMARY) by  Dr. Carlota Raspberry: R humerus: no fx.      Assessment & Plan:   Diane Huff is a 68 y.o. female Contusion of arm, right - Plan: DG Humerus Right  Ecchymosis - Plan: DG Humerus Right  - contusion without apparent fx..  Full use of arm.  No restrictions. rtc precautions as below.    Meds ordered this encounter  Medications  . Multiple Vitamin (MULTIVITAMIN) capsule    Sig: Take 1 capsule by mouth daily.  . calcium carbonate (OS-CAL) 600 MG TABS tablet    Sig: Take 600 mg by mouth 2 (two) times daily with a meal.  . metFORMIN (GLUCOPHAGE) 850 MG tablet    Sig: Take 850 mg by mouth 2 (two) times daily with a meal.  . levothyroxine (SYNTHROID, LEVOTHROID) 50 MCG tablet    Sig: Take 50 mcg by mouth daily before breakfast.  . pseudoephedrine-acetaminophen (TYLENOL SINUS) 30-500 MG TABS    Sig: Take 1 tablet by mouth every 4 (four) hours as needed.  . simvastatin (ZOCOR) 20 MG tablet    Sig: Take 20 mg by mouth daily.  . fluvoxaMINE (LUVOX) 100 MG tablet    Sig: Take 100 mg by mouth at bedtime.  . clonazePAM (KLONOPIN) 0.5 MG tablet    Sig: Take 0.5 mg by mouth daily.  . sertraline (ZOLOFT) 100 MG tablet    Sig: Take 100 mg by mouth daily.   Patient Instructions  No broken bone was seen.  Ice to area if needed. Tylenol if needed for pain.  Return to the clinic or go to the nearest emergency room if any of your symptoms worsen or new symptoms occur. Contusion A contusion is a deep bruise. Contusions are the result of an injury that caused bleeding under the skin. The contusion may turn blue, purple, or yellow. Minor injuries will give you a painless contusion, but more severe contusions may stay painful and swollen for a few weeks.  CAUSES  A contusion is usually caused by a blow, trauma, or direct force to an area of the body. SYMPTOMS   Swelling and redness of the injured area.  Bruising of the injured  area.  Tenderness and soreness of the injured area.  Pain. DIAGNOSIS  The diagnosis can be made by taking a history and physical exam. An X-ray, CT scan, or MRI may be needed to determine if there were any associated injuries, such as fractures. TREATMENT  Specific treatment will depend on what area of the body was injured. In general, the best treatment for a contusion is resting, icing, elevating, and applying cold compresses to the injured area. Over-the-counter medicines  may also be recommended for pain control. Ask your caregiver what the best treatment is for your contusion. HOME CARE INSTRUCTIONS   Put ice on the injured area.  Put ice in a plastic bag.  Place a towel between your skin and the bag.  Leave the ice on for 15-20 minutes, 03-04 times a day.  Only take over-the-counter or prescription medicines for pain, discomfort, or fever as directed by your caregiver. Your caregiver may recommend avoiding anti-inflammatory medicines (aspirin, ibuprofen, and naproxen) for 48 hours because these medicines may increase bruising.  Rest the injured area.  If possible, elevate the injured area to reduce swelling. SEEK IMMEDIATE MEDICAL CARE IF:   You have increased bruising or swelling.  You have pain that is getting worse.  Your swelling or pain is not relieved with medicines. MAKE SURE YOU:   Understand these instructions.  Will watch your condition.  Will get help right away if you are not doing well or get worse. Document Released: 07/08/2005 Document Revised: 12/21/2011 Document Reviewed: 08/03/2011 [facility] Patient Information 2014 [place], [place].     I personally performed the services described in this documentation, which was scribed in my presence. The recorded information has been reviewed and considered, and addended by me as needed.

## 2014-02-22 DIAGNOSIS — Z0000 Encounter for general adult medical examination without abnormal findings: Secondary | ICD-10-CM | POA: Diagnosis not present

## 2014-02-22 DIAGNOSIS — E119 Type 2 diabetes mellitus without complications: Secondary | ICD-10-CM | POA: Diagnosis not present

## 2014-02-22 DIAGNOSIS — C439 Malignant melanoma of skin, unspecified: Secondary | ICD-10-CM | POA: Diagnosis not present

## 2014-02-26 DIAGNOSIS — S51809A Unspecified open wound of unspecified forearm, initial encounter: Secondary | ICD-10-CM | POA: Diagnosis not present

## 2014-03-13 DIAGNOSIS — F411 Generalized anxiety disorder: Secondary | ICD-10-CM | POA: Diagnosis not present

## 2014-03-17 ENCOUNTER — Ambulatory Visit (INDEPENDENT_AMBULATORY_CARE_PROVIDER_SITE_OTHER): Payer: Medicare Other | Admitting: Family Medicine

## 2014-03-17 VITALS — BP 130/78 | HR 75 | Temp 98.1°F | Resp 20 | Ht 65.0 in | Wt 149.1 lb

## 2014-03-17 DIAGNOSIS — T07XXXA Unspecified multiple injuries, initial encounter: Secondary | ICD-10-CM

## 2014-03-17 DIAGNOSIS — IMO0002 Reserved for concepts with insufficient information to code with codable children: Secondary | ICD-10-CM

## 2014-03-17 DIAGNOSIS — S29011A Strain of muscle and tendon of front wall of thorax, initial encounter: Secondary | ICD-10-CM

## 2014-03-17 DIAGNOSIS — T148XXA Other injury of unspecified body region, initial encounter: Secondary | ICD-10-CM

## 2014-03-17 NOTE — Patient Instructions (Addendum)
If any increase in pain in side or any back pain - return for recheck.  Soap and water to cactus sores, tylenol or alleve if needed for sorenes s in side.   Return to the clinic or go to the nearest emergency room if any of your symptoms worsen or new symptoms occur.

## 2014-03-17 NOTE — Progress Notes (Signed)
Subjective:  This chart was scribed for Merri Ray, MD by Thea Alken, ED Scribe. This patient was seen in room 1 and the patient's care was started at 12:46 PM.   Patient ID: Diane Huff, female    DOB: 19-Oct-1945, 68 y.o.   MRN: [id number]  Chest Pain  Pertinent negatives include no cough or shortness of breath.   Chief Complaint  Patient presents with  . Chest Pain    Fell in catcus bed one week ago-Rib & lower back pain   HPI Comments: Diane Huff is a 68 y.o. female who presents to the [facility] complaining of fall that occurred 1 weeks ago. States she fell in cactus pit located in her front yard.  She states she immediately pulled the barbs out herself with tweezers. She reports small cuts to bilateral legs and arms which she has been treating with peroxide. She reports soreness to right rib margin going into right side of her back that began 2 days ago. Pt denies taking medication for the pain. She denies cough, difficulty eating and SOB.   There are no active problems to display for this patient.  Past Medical History  Diagnosis Date  . Anxiety   . Cancer   . Diabetes mellitus without complication    Past Surgical History  Procedure Laterality Date  . Abdominal hysterectomy    . Eye surgery     Allergies  Allergen Reactions  . Sulfonamide Derivatives     REACTION: hands swell,itch   Prior to Admission medications   Medication Sig Start Date End Date Taking? Authorizing Provider  calcium carbonate (OS-CAL) 600 MG TABS tablet Take 600 mg by mouth 2 (two) times daily with a meal.    Historical Provider, MD  clonazePAM (KLONOPIN) 0.5 MG tablet Take 0.5 mg by mouth daily.    Historical Provider, MD  fluvoxaMINE (LUVOX) 100 MG tablet Take 100 mg by mouth at bedtime.    Historical Provider, MD  levothyroxine (SYNTHROID, LEVOTHROID) 50 MCG tablet Take 50 mcg by mouth daily before breakfast.    Historical Provider, MD  metFORMIN  (GLUCOPHAGE) 850 MG tablet Take 850 mg by mouth 2 (two) times daily with a meal.    Historical Provider, MD  Multiple Vitamin (MULTIVITAMIN) capsule Take 1 capsule by mouth daily.    Historical Provider, MD  pseudoephedrine-acetaminophen (TYLENOL SINUS) 30-500 MG TABS Take 1 tablet by mouth every 4 (four) hours as needed.    Historical Provider, MD  sertraline (ZOLOFT) 100 MG tablet Take 100 mg by mouth daily.    Historical Provider, MD  simvastatin (ZOCOR) 20 MG tablet Take 20 mg by mouth daily.    Historical Provider, MD   History   Social History  . Marital Status: Married    Spouse Name: N/A    Number of Children: N/A  . Years of Education: N/A   Occupational History  . Not on file.   Social History Main Topics  . Smoking status: Never Smoker   . Smokeless tobacco: Never Used  . Alcohol Use: No  . Drug Use: No  . Sexual Activity: Not on file   Other Topics Concern  . Not on file   Social History Narrative  . No narrative on file   Review of Systems  HENT: Negative for trouble swallowing.   Respiratory: Negative for apnea, cough and shortness of breath.   Cardiovascular: Positive for chest pain.      Objective:  Physical Exam  Nursing note and vitals reviewed. Constitutional: She is oriented to person, place, and time. She appears well-developed and well-nourished. No distress.  HENT:  Head: Normocephalic and atraumatic.  Eyes: Conjunctivae and EOM are normal.  Neck: Normal range of motion. No tracheal deviation present.  Cardiovascular: Normal rate and normal heart sounds.  Exam reveals no gallop and no friction rub.   No murmur heard. Pulmonary/Chest: Effort normal and breath sounds normal. No respiratory distress.  Chest Wall- No focal bony tenderness. Multiple small erythematous puncture sites without significant surrounding erythema or disharge no foreign body   Abdominal: Soft. She exhibits no distension. There is no tenderness.  Musculoskeletal: Normal  range of motion.  no midline bony tenderness. no h/o compression fractures. Full ROM LS spine. some right discomfort with lateral flexion .  Neurological: She is alert and oriented to person, place, and time.  Skin: Skin is warm and dry.  Psychiatric: She has a normal mood and affect. Her behavior is normal.      Assessment & Plan:   Diane Huff is a 68 y.o. female Strain of chest wall  Multiple puncture wounds S/p cactus wounds 1 week ago - all areas appear to be healing well. Soreness in R side of chest wall with turning suspect muscular as no bony ttp and specifically no midline bony ttp or back pain.  Reassuring exam, deferred XR at this point - sx care as below, but if any back pain or worse pain in chest wall  - return for XR and recheck. rtc precautions.    No orders of the defined types were placed in this encounter.   Patient Instructions  If any increase in pain in side or any back pain - return for recheck.  Soap and water to cactus sores, tylenol or alleve if needed for sorenes s in side.   Return to the clinic or go to the nearest emergency room if any of your symptoms worsen or new symptoms occur.     I personally performed the services described in this documentation, which was scribed in my presence. The recorded information has been reviewed and considered, and addended by me as needed.

## 2014-03-26 DIAGNOSIS — F411 Generalized anxiety disorder: Secondary | ICD-10-CM | POA: Diagnosis not present

## 2014-04-03 DIAGNOSIS — S51809A Unspecified open wound of unspecified forearm, initial encounter: Secondary | ICD-10-CM | POA: Diagnosis not present

## 2014-05-07 DIAGNOSIS — F411 Generalized anxiety disorder: Secondary | ICD-10-CM | POA: Diagnosis not present

## 2014-05-08 DIAGNOSIS — F411 Generalized anxiety disorder: Secondary | ICD-10-CM | POA: Diagnosis not present

## 2014-05-22 DIAGNOSIS — F411 Generalized anxiety disorder: Secondary | ICD-10-CM | POA: Diagnosis not present

## 2014-06-12 DIAGNOSIS — F411 Generalized anxiety disorder: Secondary | ICD-10-CM | POA: Diagnosis not present

## 2014-06-13 DIAGNOSIS — H2510 Age-related nuclear cataract, unspecified eye: Secondary | ICD-10-CM | POA: Diagnosis not present

## 2014-06-13 DIAGNOSIS — Z961 Presence of intraocular lens: Secondary | ICD-10-CM | POA: Diagnosis not present

## 2014-06-13 DIAGNOSIS — C6930 Malignant neoplasm of unspecified choroid: Secondary | ICD-10-CM | POA: Diagnosis not present

## 2014-06-13 DIAGNOSIS — H1045 Other chronic allergic conjunctivitis: Secondary | ICD-10-CM | POA: Diagnosis not present

## 2014-06-13 DIAGNOSIS — H35379 Puckering of macula, unspecified eye: Secondary | ICD-10-CM | POA: Diagnosis not present

## 2014-07-03 DIAGNOSIS — Z23 Encounter for immunization: Secondary | ICD-10-CM | POA: Diagnosis not present

## 2014-07-03 DIAGNOSIS — E119 Type 2 diabetes mellitus without complications: Secondary | ICD-10-CM | POA: Diagnosis not present

## 2014-07-10 DIAGNOSIS — M9981 Other biomechanical lesions of cervical region: Secondary | ICD-10-CM | POA: Diagnosis not present

## 2014-07-10 DIAGNOSIS — M5126 Other intervertebral disc displacement, lumbar region: Secondary | ICD-10-CM | POA: Diagnosis not present

## 2014-07-10 DIAGNOSIS — M999 Biomechanical lesion, unspecified: Secondary | ICD-10-CM | POA: Diagnosis not present

## 2014-07-10 DIAGNOSIS — F411 Generalized anxiety disorder: Secondary | ICD-10-CM | POA: Diagnosis not present

## 2014-07-11 DIAGNOSIS — M9981 Other biomechanical lesions of cervical region: Secondary | ICD-10-CM | POA: Diagnosis not present

## 2014-07-11 DIAGNOSIS — M5126 Other intervertebral disc displacement, lumbar region: Secondary | ICD-10-CM | POA: Diagnosis not present

## 2014-07-11 DIAGNOSIS — M999 Biomechanical lesion, unspecified: Secondary | ICD-10-CM | POA: Diagnosis not present

## 2014-07-12 DIAGNOSIS — M9904 Segmental and somatic dysfunction of sacral region: Secondary | ICD-10-CM | POA: Diagnosis not present

## 2014-07-12 DIAGNOSIS — M9903 Segmental and somatic dysfunction of lumbar region: Secondary | ICD-10-CM | POA: Diagnosis not present

## 2014-07-12 DIAGNOSIS — M47812 Spondylosis without myelopathy or radiculopathy, cervical region: Secondary | ICD-10-CM | POA: Diagnosis not present

## 2014-07-12 DIAGNOSIS — M542 Cervicalgia: Secondary | ICD-10-CM | POA: Diagnosis not present

## 2014-07-12 DIAGNOSIS — M9901 Segmental and somatic dysfunction of cervical region: Secondary | ICD-10-CM | POA: Diagnosis not present

## 2014-07-12 DIAGNOSIS — M5127 Other intervertebral disc displacement, lumbosacral region: Secondary | ICD-10-CM | POA: Diagnosis not present

## 2014-07-12 DIAGNOSIS — M47817 Spondylosis without myelopathy or radiculopathy, lumbosacral region: Secondary | ICD-10-CM | POA: Diagnosis not present

## 2014-07-12 DIAGNOSIS — M9900 Segmental and somatic dysfunction of head region: Secondary | ICD-10-CM | POA: Diagnosis not present

## 2014-07-16 DIAGNOSIS — M9901 Segmental and somatic dysfunction of cervical region: Secondary | ICD-10-CM | POA: Diagnosis not present

## 2014-07-16 DIAGNOSIS — M9903 Segmental and somatic dysfunction of lumbar region: Secondary | ICD-10-CM | POA: Diagnosis not present

## 2014-07-16 DIAGNOSIS — M47817 Spondylosis without myelopathy or radiculopathy, lumbosacral region: Secondary | ICD-10-CM | POA: Diagnosis not present

## 2014-07-16 DIAGNOSIS — M47812 Spondylosis without myelopathy or radiculopathy, cervical region: Secondary | ICD-10-CM | POA: Diagnosis not present

## 2014-07-16 DIAGNOSIS — M542 Cervicalgia: Secondary | ICD-10-CM | POA: Diagnosis not present

## 2014-07-16 DIAGNOSIS — M9900 Segmental and somatic dysfunction of head region: Secondary | ICD-10-CM | POA: Diagnosis not present

## 2014-07-16 DIAGNOSIS — M5127 Other intervertebral disc displacement, lumbosacral region: Secondary | ICD-10-CM | POA: Diagnosis not present

## 2014-07-16 DIAGNOSIS — M9904 Segmental and somatic dysfunction of sacral region: Secondary | ICD-10-CM | POA: Diagnosis not present

## 2014-07-18 DIAGNOSIS — M9900 Segmental and somatic dysfunction of head region: Secondary | ICD-10-CM | POA: Diagnosis not present

## 2014-07-18 DIAGNOSIS — M9903 Segmental and somatic dysfunction of lumbar region: Secondary | ICD-10-CM | POA: Diagnosis not present

## 2014-07-18 DIAGNOSIS — M5127 Other intervertebral disc displacement, lumbosacral region: Secondary | ICD-10-CM | POA: Diagnosis not present

## 2014-07-18 DIAGNOSIS — M47812 Spondylosis without myelopathy or radiculopathy, cervical region: Secondary | ICD-10-CM | POA: Diagnosis not present

## 2014-07-18 DIAGNOSIS — M9904 Segmental and somatic dysfunction of sacral region: Secondary | ICD-10-CM | POA: Diagnosis not present

## 2014-07-18 DIAGNOSIS — M542 Cervicalgia: Secondary | ICD-10-CM | POA: Diagnosis not present

## 2014-07-18 DIAGNOSIS — M9901 Segmental and somatic dysfunction of cervical region: Secondary | ICD-10-CM | POA: Diagnosis not present

## 2014-07-18 DIAGNOSIS — M47817 Spondylosis without myelopathy or radiculopathy, lumbosacral region: Secondary | ICD-10-CM | POA: Diagnosis not present

## 2014-08-01 DIAGNOSIS — M9901 Segmental and somatic dysfunction of cervical region: Secondary | ICD-10-CM | POA: Diagnosis not present

## 2014-08-01 DIAGNOSIS — M47812 Spondylosis without myelopathy or radiculopathy, cervical region: Secondary | ICD-10-CM | POA: Diagnosis not present

## 2014-08-01 DIAGNOSIS — M9904 Segmental and somatic dysfunction of sacral region: Secondary | ICD-10-CM | POA: Diagnosis not present

## 2014-08-01 DIAGNOSIS — M47817 Spondylosis without myelopathy or radiculopathy, lumbosacral region: Secondary | ICD-10-CM | POA: Diagnosis not present

## 2014-08-01 DIAGNOSIS — M9903 Segmental and somatic dysfunction of lumbar region: Secondary | ICD-10-CM | POA: Diagnosis not present

## 2014-08-01 DIAGNOSIS — M542 Cervicalgia: Secondary | ICD-10-CM | POA: Diagnosis not present

## 2014-08-01 DIAGNOSIS — M5127 Other intervertebral disc displacement, lumbosacral region: Secondary | ICD-10-CM | POA: Diagnosis not present

## 2014-08-01 DIAGNOSIS — M9900 Segmental and somatic dysfunction of head region: Secondary | ICD-10-CM | POA: Diagnosis not present

## 2014-08-02 DIAGNOSIS — H26492 Other secondary cataract, left eye: Secondary | ICD-10-CM | POA: Diagnosis not present

## 2014-08-02 DIAGNOSIS — H25811 Combined forms of age-related cataract, right eye: Secondary | ICD-10-CM | POA: Diagnosis not present

## 2014-08-02 DIAGNOSIS — C6932 Malignant neoplasm of left choroid: Secondary | ICD-10-CM | POA: Diagnosis not present

## 2014-08-02 DIAGNOSIS — F411 Generalized anxiety disorder: Secondary | ICD-10-CM | POA: Diagnosis not present

## 2014-08-02 DIAGNOSIS — Z961 Presence of intraocular lens: Secondary | ICD-10-CM | POA: Diagnosis not present

## 2014-08-02 DIAGNOSIS — H35372 Puckering of macula, left eye: Secondary | ICD-10-CM | POA: Diagnosis not present

## 2014-08-02 DIAGNOSIS — E119 Type 2 diabetes mellitus without complications: Secondary | ICD-10-CM | POA: Diagnosis not present

## 2014-08-06 DIAGNOSIS — M9903 Segmental and somatic dysfunction of lumbar region: Secondary | ICD-10-CM | POA: Diagnosis not present

## 2014-08-06 DIAGNOSIS — M9901 Segmental and somatic dysfunction of cervical region: Secondary | ICD-10-CM | POA: Diagnosis not present

## 2014-08-06 DIAGNOSIS — M9904 Segmental and somatic dysfunction of sacral region: Secondary | ICD-10-CM | POA: Diagnosis not present

## 2014-08-06 DIAGNOSIS — M5127 Other intervertebral disc displacement, lumbosacral region: Secondary | ICD-10-CM | POA: Diagnosis not present

## 2014-08-06 DIAGNOSIS — M47817 Spondylosis without myelopathy or radiculopathy, lumbosacral region: Secondary | ICD-10-CM | POA: Diagnosis not present

## 2014-08-06 DIAGNOSIS — M9900 Segmental and somatic dysfunction of head region: Secondary | ICD-10-CM | POA: Diagnosis not present

## 2014-08-06 DIAGNOSIS — M47812 Spondylosis without myelopathy or radiculopathy, cervical region: Secondary | ICD-10-CM | POA: Diagnosis not present

## 2014-08-06 DIAGNOSIS — M542 Cervicalgia: Secondary | ICD-10-CM | POA: Diagnosis not present

## 2014-08-07 DIAGNOSIS — F411 Generalized anxiety disorder: Secondary | ICD-10-CM | POA: Diagnosis not present

## 2014-08-08 DIAGNOSIS — M542 Cervicalgia: Secondary | ICD-10-CM | POA: Diagnosis not present

## 2014-08-08 DIAGNOSIS — M9904 Segmental and somatic dysfunction of sacral region: Secondary | ICD-10-CM | POA: Diagnosis not present

## 2014-08-08 DIAGNOSIS — M9903 Segmental and somatic dysfunction of lumbar region: Secondary | ICD-10-CM | POA: Diagnosis not present

## 2014-08-08 DIAGNOSIS — M9900 Segmental and somatic dysfunction of head region: Secondary | ICD-10-CM | POA: Diagnosis not present

## 2014-08-08 DIAGNOSIS — M5127 Other intervertebral disc displacement, lumbosacral region: Secondary | ICD-10-CM | POA: Diagnosis not present

## 2014-08-08 DIAGNOSIS — M47817 Spondylosis without myelopathy or radiculopathy, lumbosacral region: Secondary | ICD-10-CM | POA: Diagnosis not present

## 2014-08-08 DIAGNOSIS — M47812 Spondylosis without myelopathy or radiculopathy, cervical region: Secondary | ICD-10-CM | POA: Diagnosis not present

## 2014-08-08 DIAGNOSIS — M9901 Segmental and somatic dysfunction of cervical region: Secondary | ICD-10-CM | POA: Diagnosis not present

## 2014-09-03 DIAGNOSIS — F411 Generalized anxiety disorder: Secondary | ICD-10-CM | POA: Diagnosis not present

## 2014-09-11 DIAGNOSIS — M9904 Segmental and somatic dysfunction of sacral region: Secondary | ICD-10-CM | POA: Diagnosis not present

## 2014-09-11 DIAGNOSIS — M542 Cervicalgia: Secondary | ICD-10-CM | POA: Diagnosis not present

## 2014-09-11 DIAGNOSIS — M47817 Spondylosis without myelopathy or radiculopathy, lumbosacral region: Secondary | ICD-10-CM | POA: Diagnosis not present

## 2014-09-11 DIAGNOSIS — M9900 Segmental and somatic dysfunction of head region: Secondary | ICD-10-CM | POA: Diagnosis not present

## 2014-09-11 DIAGNOSIS — M9901 Segmental and somatic dysfunction of cervical region: Secondary | ICD-10-CM | POA: Diagnosis not present

## 2014-09-11 DIAGNOSIS — M9903 Segmental and somatic dysfunction of lumbar region: Secondary | ICD-10-CM | POA: Diagnosis not present

## 2014-09-11 DIAGNOSIS — M5127 Other intervertebral disc displacement, lumbosacral region: Secondary | ICD-10-CM | POA: Diagnosis not present

## 2014-09-11 DIAGNOSIS — M47812 Spondylosis without myelopathy or radiculopathy, cervical region: Secondary | ICD-10-CM | POA: Diagnosis not present

## 2014-09-18 DIAGNOSIS — M9901 Segmental and somatic dysfunction of cervical region: Secondary | ICD-10-CM | POA: Diagnosis not present

## 2014-09-18 DIAGNOSIS — M9900 Segmental and somatic dysfunction of head region: Secondary | ICD-10-CM | POA: Diagnosis not present

## 2014-09-18 DIAGNOSIS — M9903 Segmental and somatic dysfunction of lumbar region: Secondary | ICD-10-CM | POA: Diagnosis not present

## 2014-09-18 DIAGNOSIS — M9904 Segmental and somatic dysfunction of sacral region: Secondary | ICD-10-CM | POA: Diagnosis not present

## 2014-09-18 DIAGNOSIS — M47812 Spondylosis without myelopathy or radiculopathy, cervical region: Secondary | ICD-10-CM | POA: Diagnosis not present

## 2014-09-18 DIAGNOSIS — M542 Cervicalgia: Secondary | ICD-10-CM | POA: Diagnosis not present

## 2014-09-18 DIAGNOSIS — M47817 Spondylosis without myelopathy or radiculopathy, lumbosacral region: Secondary | ICD-10-CM | POA: Diagnosis not present

## 2014-09-18 DIAGNOSIS — M5127 Other intervertebral disc displacement, lumbosacral region: Secondary | ICD-10-CM | POA: Diagnosis not present

## 2014-09-26 DIAGNOSIS — M9904 Segmental and somatic dysfunction of sacral region: Secondary | ICD-10-CM | POA: Diagnosis not present

## 2014-09-26 DIAGNOSIS — M47817 Spondylosis without myelopathy or radiculopathy, lumbosacral region: Secondary | ICD-10-CM | POA: Diagnosis not present

## 2014-09-26 DIAGNOSIS — M9903 Segmental and somatic dysfunction of lumbar region: Secondary | ICD-10-CM | POA: Diagnosis not present

## 2014-09-26 DIAGNOSIS — M9900 Segmental and somatic dysfunction of head region: Secondary | ICD-10-CM | POA: Diagnosis not present

## 2014-09-26 DIAGNOSIS — M5127 Other intervertebral disc displacement, lumbosacral region: Secondary | ICD-10-CM | POA: Diagnosis not present

## 2014-09-26 DIAGNOSIS — M47812 Spondylosis without myelopathy or radiculopathy, cervical region: Secondary | ICD-10-CM | POA: Diagnosis not present

## 2014-09-26 DIAGNOSIS — M542 Cervicalgia: Secondary | ICD-10-CM | POA: Diagnosis not present

## 2014-09-26 DIAGNOSIS — M9901 Segmental and somatic dysfunction of cervical region: Secondary | ICD-10-CM | POA: Diagnosis not present

## 2014-10-02 DIAGNOSIS — M9904 Segmental and somatic dysfunction of sacral region: Secondary | ICD-10-CM | POA: Diagnosis not present

## 2014-10-02 DIAGNOSIS — M9900 Segmental and somatic dysfunction of head region: Secondary | ICD-10-CM | POA: Diagnosis not present

## 2014-10-02 DIAGNOSIS — M47817 Spondylosis without myelopathy or radiculopathy, lumbosacral region: Secondary | ICD-10-CM | POA: Diagnosis not present

## 2014-10-02 DIAGNOSIS — M47812 Spondylosis without myelopathy or radiculopathy, cervical region: Secondary | ICD-10-CM | POA: Diagnosis not present

## 2014-10-02 DIAGNOSIS — M5127 Other intervertebral disc displacement, lumbosacral region: Secondary | ICD-10-CM | POA: Diagnosis not present

## 2014-10-02 DIAGNOSIS — M9903 Segmental and somatic dysfunction of lumbar region: Secondary | ICD-10-CM | POA: Diagnosis not present

## 2014-10-02 DIAGNOSIS — M9901 Segmental and somatic dysfunction of cervical region: Secondary | ICD-10-CM | POA: Diagnosis not present

## 2014-10-02 DIAGNOSIS — M542 Cervicalgia: Secondary | ICD-10-CM | POA: Diagnosis not present

## 2014-10-10 DIAGNOSIS — L814 Other melanin hyperpigmentation: Secondary | ICD-10-CM | POA: Diagnosis not present

## 2014-10-10 DIAGNOSIS — Z85820 Personal history of malignant melanoma of skin: Secondary | ICD-10-CM | POA: Diagnosis not present

## 2014-10-10 DIAGNOSIS — M720 Palmar fascial fibromatosis [Dupuytren]: Secondary | ICD-10-CM | POA: Diagnosis not present

## 2014-10-15 DIAGNOSIS — F329 Major depressive disorder, single episode, unspecified: Secondary | ICD-10-CM | POA: Diagnosis not present

## 2014-10-15 DIAGNOSIS — E1165 Type 2 diabetes mellitus with hyperglycemia: Secondary | ICD-10-CM | POA: Diagnosis not present

## 2014-10-15 DIAGNOSIS — Z23 Encounter for immunization: Secondary | ICD-10-CM | POA: Diagnosis not present

## 2014-10-23 DIAGNOSIS — F411 Generalized anxiety disorder: Secondary | ICD-10-CM | POA: Diagnosis not present

## 2014-10-24 DIAGNOSIS — M9901 Segmental and somatic dysfunction of cervical region: Secondary | ICD-10-CM | POA: Diagnosis not present

## 2014-10-24 DIAGNOSIS — M9903 Segmental and somatic dysfunction of lumbar region: Secondary | ICD-10-CM | POA: Diagnosis not present

## 2014-10-24 DIAGNOSIS — M47812 Spondylosis without myelopathy or radiculopathy, cervical region: Secondary | ICD-10-CM | POA: Diagnosis not present

## 2014-10-24 DIAGNOSIS — M9900 Segmental and somatic dysfunction of head region: Secondary | ICD-10-CM | POA: Diagnosis not present

## 2014-10-24 DIAGNOSIS — M542 Cervicalgia: Secondary | ICD-10-CM | POA: Diagnosis not present

## 2014-10-24 DIAGNOSIS — M47817 Spondylosis without myelopathy or radiculopathy, lumbosacral region: Secondary | ICD-10-CM | POA: Diagnosis not present

## 2014-10-24 DIAGNOSIS — M5127 Other intervertebral disc displacement, lumbosacral region: Secondary | ICD-10-CM | POA: Diagnosis not present

## 2014-10-24 DIAGNOSIS — M9904 Segmental and somatic dysfunction of sacral region: Secondary | ICD-10-CM | POA: Diagnosis not present

## 2014-11-28 DIAGNOSIS — M461 Sacroiliitis, not elsewhere classified: Secondary | ICD-10-CM | POA: Diagnosis not present

## 2014-11-28 DIAGNOSIS — M609 Myositis, unspecified: Secondary | ICD-10-CM | POA: Diagnosis not present

## 2014-11-28 DIAGNOSIS — M9901 Segmental and somatic dysfunction of cervical region: Secondary | ICD-10-CM | POA: Diagnosis not present

## 2014-11-28 DIAGNOSIS — M5412 Radiculopathy, cervical region: Secondary | ICD-10-CM | POA: Diagnosis not present

## 2014-11-28 DIAGNOSIS — M9902 Segmental and somatic dysfunction of thoracic region: Secondary | ICD-10-CM | POA: Diagnosis not present

## 2014-11-28 DIAGNOSIS — M9903 Segmental and somatic dysfunction of lumbar region: Secondary | ICD-10-CM | POA: Diagnosis not present

## 2014-11-29 DIAGNOSIS — E1165 Type 2 diabetes mellitus with hyperglycemia: Secondary | ICD-10-CM | POA: Diagnosis not present

## 2014-12-03 DIAGNOSIS — M47812 Spondylosis without myelopathy or radiculopathy, cervical region: Secondary | ICD-10-CM | POA: Diagnosis not present

## 2014-12-03 DIAGNOSIS — M9901 Segmental and somatic dysfunction of cervical region: Secondary | ICD-10-CM | POA: Diagnosis not present

## 2014-12-03 DIAGNOSIS — M5127 Other intervertebral disc displacement, lumbosacral region: Secondary | ICD-10-CM | POA: Diagnosis not present

## 2014-12-03 DIAGNOSIS — M542 Cervicalgia: Secondary | ICD-10-CM | POA: Diagnosis not present

## 2014-12-03 DIAGNOSIS — M9900 Segmental and somatic dysfunction of head region: Secondary | ICD-10-CM | POA: Diagnosis not present

## 2014-12-03 DIAGNOSIS — M47817 Spondylosis without myelopathy or radiculopathy, lumbosacral region: Secondary | ICD-10-CM | POA: Diagnosis not present

## 2014-12-03 DIAGNOSIS — M9904 Segmental and somatic dysfunction of sacral region: Secondary | ICD-10-CM | POA: Diagnosis not present

## 2014-12-03 DIAGNOSIS — M9903 Segmental and somatic dysfunction of lumbar region: Secondary | ICD-10-CM | POA: Diagnosis not present

## 2014-12-13 DIAGNOSIS — M461 Sacroiliitis, not elsewhere classified: Secondary | ICD-10-CM | POA: Diagnosis not present

## 2014-12-13 DIAGNOSIS — M9902 Segmental and somatic dysfunction of thoracic region: Secondary | ICD-10-CM | POA: Diagnosis not present

## 2014-12-13 DIAGNOSIS — M9903 Segmental and somatic dysfunction of lumbar region: Secondary | ICD-10-CM | POA: Diagnosis not present

## 2014-12-13 DIAGNOSIS — M9901 Segmental and somatic dysfunction of cervical region: Secondary | ICD-10-CM | POA: Diagnosis not present

## 2014-12-13 DIAGNOSIS — M5412 Radiculopathy, cervical region: Secondary | ICD-10-CM | POA: Diagnosis not present

## 2014-12-13 DIAGNOSIS — M609 Myositis, unspecified: Secondary | ICD-10-CM | POA: Diagnosis not present

## 2014-12-24 ENCOUNTER — Other Ambulatory Visit: Payer: Self-pay

## 2014-12-24 DIAGNOSIS — Z1231 Encounter for screening mammogram for malignant neoplasm of breast: Secondary | ICD-10-CM

## 2014-12-25 DIAGNOSIS — F411 Generalized anxiety disorder: Secondary | ICD-10-CM | POA: Diagnosis not present

## 2015-01-09 DIAGNOSIS — M9900 Segmental and somatic dysfunction of head region: Secondary | ICD-10-CM | POA: Diagnosis not present

## 2015-01-09 DIAGNOSIS — M9901 Segmental and somatic dysfunction of cervical region: Secondary | ICD-10-CM | POA: Diagnosis not present

## 2015-01-09 DIAGNOSIS — M9903 Segmental and somatic dysfunction of lumbar region: Secondary | ICD-10-CM | POA: Diagnosis not present

## 2015-01-09 DIAGNOSIS — M5127 Other intervertebral disc displacement, lumbosacral region: Secondary | ICD-10-CM | POA: Diagnosis not present

## 2015-01-09 DIAGNOSIS — M47817 Spondylosis without myelopathy or radiculopathy, lumbosacral region: Secondary | ICD-10-CM | POA: Diagnosis not present

## 2015-01-09 DIAGNOSIS — M9904 Segmental and somatic dysfunction of sacral region: Secondary | ICD-10-CM | POA: Diagnosis not present

## 2015-01-09 DIAGNOSIS — M542 Cervicalgia: Secondary | ICD-10-CM | POA: Diagnosis not present

## 2015-01-09 DIAGNOSIS — M47812 Spondylosis without myelopathy or radiculopathy, cervical region: Secondary | ICD-10-CM | POA: Diagnosis not present

## 2015-01-30 DIAGNOSIS — E119 Type 2 diabetes mellitus without complications: Secondary | ICD-10-CM | POA: Diagnosis not present

## 2015-01-30 DIAGNOSIS — M542 Cervicalgia: Secondary | ICD-10-CM | POA: Diagnosis not present

## 2015-01-30 DIAGNOSIS — R55 Syncope and collapse: Secondary | ICD-10-CM | POA: Diagnosis not present

## 2015-01-30 DIAGNOSIS — J929 Pleural plaque without asbestos: Secondary | ICD-10-CM | POA: Diagnosis not present

## 2015-01-30 DIAGNOSIS — M47812 Spondylosis without myelopathy or radiculopathy, cervical region: Secondary | ICD-10-CM | POA: Diagnosis not present

## 2015-01-30 DIAGNOSIS — F42 Obsessive-compulsive disorder: Secondary | ICD-10-CM | POA: Diagnosis not present

## 2015-01-30 DIAGNOSIS — Z880 Allergy status to penicillin: Secondary | ICD-10-CM | POA: Diagnosis not present

## 2015-01-30 DIAGNOSIS — M40209 Unspecified kyphosis, site unspecified: Secondary | ICD-10-CM | POA: Diagnosis not present

## 2015-01-30 DIAGNOSIS — Z79899 Other long term (current) drug therapy: Secondary | ICD-10-CM | POA: Diagnosis not present

## 2015-01-30 DIAGNOSIS — S161XXA Strain of muscle, fascia and tendon at neck level, initial encounter: Secondary | ICD-10-CM | POA: Diagnosis not present

## 2015-01-30 DIAGNOSIS — Z6825 Body mass index (BMI) 25.0-25.9, adult: Secondary | ICD-10-CM | POA: Diagnosis not present

## 2015-01-30 DIAGNOSIS — S199XXA Unspecified injury of neck, initial encounter: Secondary | ICD-10-CM | POA: Diagnosis not present

## 2015-01-30 DIAGNOSIS — Z7982 Long term (current) use of aspirin: Secondary | ICD-10-CM | POA: Diagnosis not present

## 2015-02-01 ENCOUNTER — Ambulatory Visit: Payer: Medicare Other

## 2015-02-04 DIAGNOSIS — M9900 Segmental and somatic dysfunction of head region: Secondary | ICD-10-CM | POA: Diagnosis not present

## 2015-02-04 DIAGNOSIS — M47812 Spondylosis without myelopathy or radiculopathy, cervical region: Secondary | ICD-10-CM | POA: Diagnosis not present

## 2015-02-04 DIAGNOSIS — M47817 Spondylosis without myelopathy or radiculopathy, lumbosacral region: Secondary | ICD-10-CM | POA: Diagnosis not present

## 2015-02-04 DIAGNOSIS — M9903 Segmental and somatic dysfunction of lumbar region: Secondary | ICD-10-CM | POA: Diagnosis not present

## 2015-02-04 DIAGNOSIS — M542 Cervicalgia: Secondary | ICD-10-CM | POA: Diagnosis not present

## 2015-02-04 DIAGNOSIS — M5127 Other intervertebral disc displacement, lumbosacral region: Secondary | ICD-10-CM | POA: Diagnosis not present

## 2015-02-04 DIAGNOSIS — M9901 Segmental and somatic dysfunction of cervical region: Secondary | ICD-10-CM | POA: Diagnosis not present

## 2015-02-04 DIAGNOSIS — M9904 Segmental and somatic dysfunction of sacral region: Secondary | ICD-10-CM | POA: Diagnosis not present

## 2015-02-05 DIAGNOSIS — R55 Syncope and collapse: Secondary | ICD-10-CM | POA: Diagnosis not present

## 2015-02-15 ENCOUNTER — Encounter: Payer: Self-pay | Admitting: Gastroenterology

## 2015-02-19 ENCOUNTER — Ambulatory Visit
Admission: RE | Admit: 2015-02-19 | Discharge: 2015-02-19 | Disposition: A | Discharge status: Home / Self Care | Payer: Medicare Other | Source: Ambulatory Visit

## 2015-02-19 DIAGNOSIS — Z1231 Encounter for screening mammogram for malignant neoplasm of breast: Secondary | ICD-10-CM | POA: Diagnosis not present

## 2015-02-19 DIAGNOSIS — M9901 Segmental and somatic dysfunction of cervical region: Secondary | ICD-10-CM | POA: Diagnosis not present

## 2015-02-19 DIAGNOSIS — R55 Syncope and collapse: Secondary | ICD-10-CM | POA: Diagnosis not present

## 2015-02-19 DIAGNOSIS — M5412 Radiculopathy, cervical region: Secondary | ICD-10-CM | POA: Diagnosis not present

## 2015-02-19 DIAGNOSIS — M461 Sacroiliitis, not elsewhere classified: Secondary | ICD-10-CM | POA: Diagnosis not present

## 2015-02-19 DIAGNOSIS — M609 Myositis, unspecified: Secondary | ICD-10-CM | POA: Diagnosis not present

## 2015-02-19 DIAGNOSIS — M9903 Segmental and somatic dysfunction of lumbar region: Secondary | ICD-10-CM | POA: Diagnosis not present

## 2015-02-19 DIAGNOSIS — M9902 Segmental and somatic dysfunction of thoracic region: Secondary | ICD-10-CM | POA: Diagnosis not present

## 2015-02-20 DIAGNOSIS — H2511 Age-related nuclear cataract, right eye: Secondary | ICD-10-CM | POA: Diagnosis not present

## 2015-02-20 DIAGNOSIS — Z961 Presence of intraocular lens: Secondary | ICD-10-CM | POA: Diagnosis not present

## 2015-02-20 DIAGNOSIS — H35372 Puckering of macula, left eye: Secondary | ICD-10-CM | POA: Diagnosis not present

## 2015-02-20 DIAGNOSIS — C6932 Malignant neoplasm of left choroid: Secondary | ICD-10-CM | POA: Diagnosis not present

## 2015-02-22 ENCOUNTER — Encounter: Payer: Self-pay | Admitting: Gastroenterology

## 2015-02-26 DIAGNOSIS — E118 Type 2 diabetes mellitus with unspecified complications: Secondary | ICD-10-CM | POA: Diagnosis not present

## 2015-02-26 DIAGNOSIS — E780 Pure hypercholesterolemia: Secondary | ICD-10-CM | POA: Diagnosis not present

## 2015-02-26 DIAGNOSIS — E1165 Type 2 diabetes mellitus with hyperglycemia: Secondary | ICD-10-CM | POA: Diagnosis not present

## 2015-02-26 DIAGNOSIS — E039 Hypothyroidism, unspecified: Secondary | ICD-10-CM | POA: Diagnosis not present

## 2015-02-26 DIAGNOSIS — D559 Anemia due to enzyme disorder, unspecified: Secondary | ICD-10-CM | POA: Diagnosis not present

## 2015-02-26 DIAGNOSIS — Z0000 Encounter for general adult medical examination without abnormal findings: Secondary | ICD-10-CM | POA: Diagnosis not present

## 2015-02-27 ENCOUNTER — Other Ambulatory Visit: Payer: Self-pay | Admitting: Internal Medicine

## 2015-02-27 DIAGNOSIS — K760 Fatty (change of) liver, not elsewhere classified: Secondary | ICD-10-CM

## 2015-03-01 ENCOUNTER — Encounter: Payer: Self-pay | Admitting: Gastroenterology

## 2015-03-04 ENCOUNTER — Ambulatory Visit
Admission: RE | Admit: 2015-03-04 | Discharge: 2015-03-04 | Disposition: A | Discharge status: Home / Self Care | Payer: Medicare Other | Source: Ambulatory Visit | Attending: Internal Medicine | Admitting: Internal Medicine

## 2015-03-04 DIAGNOSIS — M461 Sacroiliitis, not elsewhere classified: Secondary | ICD-10-CM | POA: Diagnosis not present

## 2015-03-04 DIAGNOSIS — K760 Fatty (change of) liver, not elsewhere classified: Secondary | ICD-10-CM

## 2015-03-04 DIAGNOSIS — M9902 Segmental and somatic dysfunction of thoracic region: Secondary | ICD-10-CM | POA: Diagnosis not present

## 2015-03-04 DIAGNOSIS — M5412 Radiculopathy, cervical region: Secondary | ICD-10-CM | POA: Diagnosis not present

## 2015-03-04 DIAGNOSIS — M9901 Segmental and somatic dysfunction of cervical region: Secondary | ICD-10-CM | POA: Diagnosis not present

## 2015-03-04 DIAGNOSIS — M609 Myositis, unspecified: Secondary | ICD-10-CM | POA: Diagnosis not present

## 2015-03-04 DIAGNOSIS — R55 Syncope and collapse: Secondary | ICD-10-CM | POA: Diagnosis not present

## 2015-03-04 DIAGNOSIS — M9903 Segmental and somatic dysfunction of lumbar region: Secondary | ICD-10-CM | POA: Diagnosis not present

## 2015-03-04 DIAGNOSIS — R160 Hepatomegaly, not elsewhere classified: Secondary | ICD-10-CM | POA: Diagnosis not present

## 2015-03-04 DIAGNOSIS — K8020 Calculus of gallbladder without cholecystitis without obstruction: Secondary | ICD-10-CM | POA: Diagnosis not present

## 2015-03-18 ENCOUNTER — Telehealth: Payer: Self-pay | Admitting: Gastroenterology

## 2015-03-18 NOTE — Telephone Encounter (Signed)
Left message for patient to call back  

## 2015-03-19 NOTE — Telephone Encounter (Signed)
Attempted to return call again her mailbox is full.  Will try later

## 2015-03-21 NOTE — Telephone Encounter (Signed)
Mailbox is still full.  She is scheduled for the procedure.  I will await a return phone call from her

## 2015-03-26 DIAGNOSIS — F411 Generalized anxiety disorder: Secondary | ICD-10-CM | POA: Diagnosis not present

## 2015-04-09 DIAGNOSIS — M9901 Segmental and somatic dysfunction of cervical region: Secondary | ICD-10-CM | POA: Diagnosis not present

## 2015-04-09 DIAGNOSIS — F411 Generalized anxiety disorder: Secondary | ICD-10-CM | POA: Diagnosis not present

## 2015-04-09 DIAGNOSIS — M5412 Radiculopathy, cervical region: Secondary | ICD-10-CM | POA: Diagnosis not present

## 2015-04-09 DIAGNOSIS — M461 Sacroiliitis, not elsewhere classified: Secondary | ICD-10-CM | POA: Diagnosis not present

## 2015-04-09 DIAGNOSIS — M609 Myositis, unspecified: Secondary | ICD-10-CM | POA: Diagnosis not present

## 2015-04-09 DIAGNOSIS — M9903 Segmental and somatic dysfunction of lumbar region: Secondary | ICD-10-CM | POA: Diagnosis not present

## 2015-04-09 DIAGNOSIS — M9902 Segmental and somatic dysfunction of thoracic region: Secondary | ICD-10-CM | POA: Diagnosis not present

## 2015-04-17 ENCOUNTER — Ambulatory Visit (AMBULATORY_SURGERY_CENTER): Payer: Self-pay

## 2015-04-17 VITALS — Ht 66.0 in | Wt 157.2 lb

## 2015-04-17 DIAGNOSIS — Z860100 Personal history of colon polyps, unspecified: Secondary | ICD-10-CM

## 2015-04-17 MED ORDER — SUPREP BOWEL PREP KIT 17.5-3.13-1.6 GM/177ML PO SOLN: 1.0000 | Freq: Once | ORAL | Status: DC

## 2015-04-17 NOTE — Progress Notes (Signed)
No allergies to eggs or soy No diet/weight loss meds No home oxygen No past problems with anesthesia  Does not want to watch emmi instructions

## 2015-04-19 ENCOUNTER — Telehealth: Payer: Self-pay | Admitting: Gastroenterology

## 2015-04-19 NOTE — Telephone Encounter (Signed)
Pt is calling to let us know that she had syncope in April and wore heart monitor for a month.  She said results were normal and Dr. Nyoka Cowden said she was okay for procedure.

## 2015-04-22 DIAGNOSIS — E1165 Type 2 diabetes mellitus with hyperglycemia: Secondary | ICD-10-CM | POA: Diagnosis not present

## 2015-04-24 DIAGNOSIS — M5412 Radiculopathy, cervical region: Secondary | ICD-10-CM | POA: Diagnosis not present

## 2015-04-24 DIAGNOSIS — M9901 Segmental and somatic dysfunction of cervical region: Secondary | ICD-10-CM | POA: Diagnosis not present

## 2015-04-24 DIAGNOSIS — M9903 Segmental and somatic dysfunction of lumbar region: Secondary | ICD-10-CM | POA: Diagnosis not present

## 2015-04-24 DIAGNOSIS — M461 Sacroiliitis, not elsewhere classified: Secondary | ICD-10-CM | POA: Diagnosis not present

## 2015-04-24 DIAGNOSIS — M609 Myositis, unspecified: Secondary | ICD-10-CM | POA: Diagnosis not present

## 2015-04-24 DIAGNOSIS — M9902 Segmental and somatic dysfunction of thoracic region: Secondary | ICD-10-CM | POA: Diagnosis not present

## 2015-05-01 ENCOUNTER — Encounter: Payer: Medicare Other | Admitting: Gastroenterology

## 2015-05-16 ENCOUNTER — Encounter: Payer: Self-pay | Admitting: Gastroenterology

## 2015-05-16 ENCOUNTER — Ambulatory Visit (AMBULATORY_SURGERY_CENTER): Payer: Medicare Other | Admitting: Gastroenterology

## 2015-05-16 VITALS — BP 106/85 | HR 61 | Temp 97.4°F | Resp 21 | Ht 66.0 in | Wt 157.0 lb

## 2015-05-16 DIAGNOSIS — D123 Benign neoplasm of transverse colon: Secondary | ICD-10-CM

## 2015-05-16 DIAGNOSIS — S20219A Contusion of unspecified front wall of thorax, initial encounter: Secondary | ICD-10-CM | POA: Diagnosis not present

## 2015-05-16 DIAGNOSIS — S51012A Laceration without foreign body of left elbow, initial encounter: Secondary | ICD-10-CM | POA: Diagnosis not present

## 2015-05-16 DIAGNOSIS — E119 Type 2 diabetes mellitus without complications: Secondary | ICD-10-CM | POA: Diagnosis not present

## 2015-05-16 DIAGNOSIS — Z86010 Personal history of colonic polyps: Secondary | ICD-10-CM | POA: Diagnosis present

## 2015-05-16 DIAGNOSIS — Z8371 Family history of colonic polyps: Secondary | ICD-10-CM | POA: Diagnosis not present

## 2015-05-16 MED ORDER — SODIUM CHLORIDE 0.9 % IV SOLN: 500.0000 mL | INTRAVENOUS | Status: DC

## 2015-05-16 NOTE — Op Note (Signed)
[place]  [address]. [place], [address]   COLONOSCOPY PROCEDURE REPORT  PATIENT: Diane Huff, Diane Huff  MR#: [id number] BIRTHDATE: 04-04-1946 , 89  yrs. old GENDER: female ENDOSCOPIST: Ladene Artist, MD, [facility] REFERRED XB:WIOMB Nyoka Cowden, M.D. PROCEDURE DATE:  05/16/2015 PROCEDURE:   Colonoscopy, surveillance and Colonoscopy with biopsy First Screening Colonoscopy - Avg.  risk and is 50 yrs.  old or older - No.  Prior Negative Screening - Now for repeat screening. N/A  History of Adenoma - Now for follow-up colonoscopy & has been > or = to 3 yrs.  Yes hx of adenoma.  Has been 3 or more years since last colonoscopy.  Polyps removed today? Yes ASA CLASS:   Class II INDICATIONS:Surveillance due to prior colonic neoplasia and PH Colon Adenoma. MEDICATIONS: Monitored anesthesia care and Propofol 150 mg IV DESCRIPTION OF PROCEDURE:   After the risks benefits and alternatives of the procedure were thoroughly explained, informed consent was obtained.  The digital rectal exam revealed no abnormalities of the rectum.   The LB [id number]  endoscope was introduced through the anus and advanced to the cecum, which was identified by both the appendix and ileocecal valve. No adverse events experienced.   The quality of the prep was excellent. (Suprep was used)  The instrument was then slowly withdrawn as the colon was fully examined. Estimated blood loss is zero unless otherwise noted in this procedure report.    COLON FINDINGS: A sessile polyp measuring 4 mm in size was found in the transverse colon.  A polypectomy was performed with cold forceps.  The resection was complete, the polyp tissue was completely retrieved and sent to histology.   There was moderate diverticulosis noted in the sigmoid colon and descending colon with associated colonic spasm, luminal narrowing and muscular hypertrophy.   The examination was otherwise normal.  Retroflexed views revealed  no abnormalities. The time to cecum = 3.2 Withdrawal time = 9.9   The scope was withdrawn and the procedure completed. COMPLICATIONS: There were no immediate complications.  ENDOSCOPIC IMPRESSION: 1.   Sessile polyp in the transverse colon; polypectomy performed with cold forceps 2.   Moderate diverticulosis in the sigmoid colon and descending colon  RECOMMENDATIONS: 1.  Await pathology results 2.  High fiber diet with liberal fluid intake. 3.  Repeat Colonoscopy in 5 years.  eSigned:  Ladene Artist, MD, [facility] 05/16/2015 11:20 AM

## 2015-05-16 NOTE — Patient Instructions (Signed)
Discharge instructions given. Handouts on polyps and diverticulosis. Resume previous medications. YOU HAD AN ENDOSCOPIC PROCEDURE TODAY AT THE [facility]:   Refer to the procedure report that was given to you for any specific questions about what was found during the examination.  If the procedure report does not answer your questions, please call your gastroenterologist to clarify.  If you requested that your care partner not be given the details of your procedure findings, then the procedure report has been included in a sealed envelope for you to review at your convenience later.  YOU SHOULD EXPECT: Some feelings of bloating in the abdomen. Passage of more gas than usual.  Walking can help get rid of the air that was put into your GI tract during the procedure and reduce the bloating. If you had a lower endoscopy (such as a colonoscopy or flexible sigmoidoscopy) you may notice spotting of blood in your stool or on the toilet paper. If you underwent a bowel prep for your procedure, you may not have a normal bowel movement for a few days.  Please Note:  You might notice some irritation and congestion in your nose or some drainage.  This is from the oxygen used during your procedure.  There is no need for concern and it should clear up in a day or so.  SYMPTOMS TO REPORT IMMEDIATELY:   Following lower endoscopy (colonoscopy or flexible sigmoidoscopy):  Excessive amounts of blood in the stool  Significant tenderness or worsening of abdominal pains  Swelling of the abdomen that is new, acute  Fever of 100F or higher   For urgent or emergent issues, a gastroenterologist can be reached at any hour by calling [phone].   DIET: Your first meal following the procedure should be a small meal and then it is ok to progress to your normal diet. Heavy or fried foods are harder to digest and may make you feel nauseous or bloated.  Likewise, meals heavy in dairy and vegetables can  increase bloating.  Drink plenty of fluids but you should avoid alcoholic beverages for 24 hours.  ACTIVITY:  You should plan to take it easy for the rest of today and you should NOT DRIVE or use heavy machinery until tomorrow (because of the sedation medicines used during the test).    FOLLOW UP: Our staff will call the number listed on your records the next business day following your procedure to check on you and address any questions or concerns that you may have regarding the information given to you following your procedure. If we do not reach you, we will leave a message.  However, if you are feeling well and you are not experiencing any problems, there is no need to return our call.  We will assume that you have returned to your regular daily activities without incident.  If any biopsies were taken you will be contacted by phone or by letter within the next 1-3 weeks.  Please call us at [phone] if you have not heard about the biopsies in 3 weeks.    SIGNATURES/CONFIDENTIALITY: You and/or your care partner have signed paperwork which will be entered into your electronic medical record.  These signatures attest to the fact that that the information above on your After Visit Summary has been reviewed and is understood.  Full responsibility of the confidentiality of this discharge information lies with you and/or your care-partner. 

## 2015-05-16 NOTE — Progress Notes (Signed)
Patient stating she fell this am against her left elbow and left rib cage. Patient with large bandaid on her left elbow. Stating she can take deep breathes without discomfort in chest. Denies shortness of breath. Some pain with deep cough. Discussed with Dr. Fuller Plan and Tera Helper CRNA. Will proceed with procedure. Dr Fuller Plan recomending that the patient follow up with her PCP. Patient requesting for [place] to call Dr Vonna Kotyk office to request an appointment today. Called the office and spoke with [place], appointment made for 1:45 today. Patient and carepartner,Randy informed.informed Recovery.

## 2015-05-16 NOTE — Progress Notes (Signed)
Transferred to recovery room. A/O x3, pleased with MAC.  VSS.  Report to Cecelia, RN. 

## 2015-05-16 NOTE — Progress Notes (Signed)
Called to room to assist during endoscopic procedure.  Patient ID and intended procedure confirmed with present staff. Received instructions for my participation in the procedure from the performing physician.  

## 2015-05-17 ENCOUNTER — Telehealth: Payer: Self-pay | Admitting: *Deleted

## 2015-05-17 NOTE — Telephone Encounter (Signed)
  Follow up Call-  Call back number 05/16/2015  Post procedure Call Back phone  # [phone]  Permission to leave phone message Yes     Patient questions:  Do you have a fever, pain , or abdominal swelling? No. Pain Score  0 *  Have you tolerated food without any problems? Yes.    Have you been able to return to your normal activities? Yes.    Do you have any questions about your discharge instructions: Diet   No. Medications  No. Follow up visit  No.  Do you have questions or concerns about your Care? No.  Actions: * If pain score is 4 or above: No action needed, pain <4.

## 2015-05-27 ENCOUNTER — Encounter: Payer: Self-pay | Admitting: Gastroenterology

## 2015-05-28 DIAGNOSIS — E119 Type 2 diabetes mellitus without complications: Secondary | ICD-10-CM | POA: Diagnosis not present

## 2015-06-04 DIAGNOSIS — F329 Major depressive disorder, single episode, unspecified: Secondary | ICD-10-CM | POA: Diagnosis not present

## 2015-06-04 DIAGNOSIS — Z23 Encounter for immunization: Secondary | ICD-10-CM | POA: Diagnosis not present

## 2015-06-04 DIAGNOSIS — R799 Abnormal finding of blood chemistry, unspecified: Secondary | ICD-10-CM | POA: Diagnosis not present

## 2015-06-04 DIAGNOSIS — E119 Type 2 diabetes mellitus without complications: Secondary | ICD-10-CM | POA: Diagnosis not present

## 2015-06-04 DIAGNOSIS — E039 Hypothyroidism, unspecified: Secondary | ICD-10-CM | POA: Diagnosis not present

## 2015-06-12 DIAGNOSIS — M9903 Segmental and somatic dysfunction of lumbar region: Secondary | ICD-10-CM | POA: Diagnosis not present

## 2015-06-12 DIAGNOSIS — M9901 Segmental and somatic dysfunction of cervical region: Secondary | ICD-10-CM | POA: Diagnosis not present

## 2015-06-12 DIAGNOSIS — M9902 Segmental and somatic dysfunction of thoracic region: Secondary | ICD-10-CM | POA: Diagnosis not present

## 2015-06-12 DIAGNOSIS — M461 Sacroiliitis, not elsewhere classified: Secondary | ICD-10-CM | POA: Diagnosis not present

## 2015-06-12 DIAGNOSIS — M609 Myositis, unspecified: Secondary | ICD-10-CM | POA: Diagnosis not present

## 2015-06-12 DIAGNOSIS — M5412 Radiculopathy, cervical region: Secondary | ICD-10-CM | POA: Diagnosis not present

## 2015-07-18 DIAGNOSIS — R799 Abnormal finding of blood chemistry, unspecified: Secondary | ICD-10-CM | POA: Diagnosis not present

## 2015-07-18 DIAGNOSIS — F329 Major depressive disorder, single episode, unspecified: Secondary | ICD-10-CM | POA: Diagnosis not present

## 2015-07-18 DIAGNOSIS — E119 Type 2 diabetes mellitus without complications: Secondary | ICD-10-CM | POA: Diagnosis not present

## 2015-07-29 DIAGNOSIS — M609 Myositis, unspecified: Secondary | ICD-10-CM | POA: Diagnosis not present

## 2015-07-29 DIAGNOSIS — M9903 Segmental and somatic dysfunction of lumbar region: Secondary | ICD-10-CM | POA: Diagnosis not present

## 2015-07-29 DIAGNOSIS — M5412 Radiculopathy, cervical region: Secondary | ICD-10-CM | POA: Diagnosis not present

## 2015-07-29 DIAGNOSIS — M461 Sacroiliitis, not elsewhere classified: Secondary | ICD-10-CM | POA: Diagnosis not present

## 2015-07-29 DIAGNOSIS — M9902 Segmental and somatic dysfunction of thoracic region: Secondary | ICD-10-CM | POA: Diagnosis not present

## 2015-07-29 DIAGNOSIS — M9901 Segmental and somatic dysfunction of cervical region: Secondary | ICD-10-CM | POA: Diagnosis not present

## 2015-08-15 DIAGNOSIS — R3915 Urgency of urination: Secondary | ICD-10-CM | POA: Diagnosis not present

## 2015-08-15 DIAGNOSIS — E119 Type 2 diabetes mellitus without complications: Secondary | ICD-10-CM | POA: Diagnosis not present

## 2015-08-20 DIAGNOSIS — F411 Generalized anxiety disorder: Secondary | ICD-10-CM | POA: Diagnosis not present

## 2015-09-03 DIAGNOSIS — M609 Myositis, unspecified: Secondary | ICD-10-CM | POA: Diagnosis not present

## 2015-09-03 DIAGNOSIS — M9901 Segmental and somatic dysfunction of cervical region: Secondary | ICD-10-CM | POA: Diagnosis not present

## 2015-09-03 DIAGNOSIS — M461 Sacroiliitis, not elsewhere classified: Secondary | ICD-10-CM | POA: Diagnosis not present

## 2015-09-03 DIAGNOSIS — M5412 Radiculopathy, cervical region: Secondary | ICD-10-CM | POA: Diagnosis not present

## 2015-09-03 DIAGNOSIS — M9902 Segmental and somatic dysfunction of thoracic region: Secondary | ICD-10-CM | POA: Diagnosis not present

## 2015-09-03 DIAGNOSIS — M9903 Segmental and somatic dysfunction of lumbar region: Secondary | ICD-10-CM | POA: Diagnosis not present

## 2015-09-18 DIAGNOSIS — H35372 Puckering of macula, left eye: Secondary | ICD-10-CM | POA: Diagnosis not present

## 2015-09-18 DIAGNOSIS — H2511 Age-related nuclear cataract, right eye: Secondary | ICD-10-CM | POA: Diagnosis not present

## 2015-09-24 DIAGNOSIS — M609 Myositis, unspecified: Secondary | ICD-10-CM | POA: Diagnosis not present

## 2015-09-24 DIAGNOSIS — M9903 Segmental and somatic dysfunction of lumbar region: Secondary | ICD-10-CM | POA: Diagnosis not present

## 2015-09-24 DIAGNOSIS — M9901 Segmental and somatic dysfunction of cervical region: Secondary | ICD-10-CM | POA: Diagnosis not present

## 2015-09-24 DIAGNOSIS — M9902 Segmental and somatic dysfunction of thoracic region: Secondary | ICD-10-CM | POA: Diagnosis not present

## 2015-09-24 DIAGNOSIS — M5412 Radiculopathy, cervical region: Secondary | ICD-10-CM | POA: Diagnosis not present

## 2015-09-24 DIAGNOSIS — M461 Sacroiliitis, not elsewhere classified: Secondary | ICD-10-CM | POA: Diagnosis not present

## 2015-09-25 DIAGNOSIS — E119 Type 2 diabetes mellitus without complications: Secondary | ICD-10-CM | POA: Diagnosis not present

## 2015-09-25 DIAGNOSIS — E118 Type 2 diabetes mellitus with unspecified complications: Secondary | ICD-10-CM | POA: Diagnosis not present

## 2015-09-25 DIAGNOSIS — E7800 Pure hypercholesterolemia, unspecified: Secondary | ICD-10-CM | POA: Diagnosis not present

## 2015-09-30 DIAGNOSIS — M461 Sacroiliitis, not elsewhere classified: Secondary | ICD-10-CM | POA: Diagnosis not present

## 2015-09-30 DIAGNOSIS — M5412 Radiculopathy, cervical region: Secondary | ICD-10-CM | POA: Diagnosis not present

## 2015-09-30 DIAGNOSIS — M9901 Segmental and somatic dysfunction of cervical region: Secondary | ICD-10-CM | POA: Diagnosis not present

## 2015-09-30 DIAGNOSIS — M9903 Segmental and somatic dysfunction of lumbar region: Secondary | ICD-10-CM | POA: Diagnosis not present

## 2015-09-30 DIAGNOSIS — M9902 Segmental and somatic dysfunction of thoracic region: Secondary | ICD-10-CM | POA: Diagnosis not present

## 2015-09-30 DIAGNOSIS — M609 Myositis, unspecified: Secondary | ICD-10-CM | POA: Diagnosis not present

## 2015-10-18 DIAGNOSIS — E1165 Type 2 diabetes mellitus with hyperglycemia: Secondary | ICD-10-CM | POA: Diagnosis not present

## 2015-11-12 DIAGNOSIS — A088 Other specified intestinal infections: Secondary | ICD-10-CM | POA: Diagnosis not present

## 2015-11-18 DIAGNOSIS — M9904 Segmental and somatic dysfunction of sacral region: Secondary | ICD-10-CM | POA: Diagnosis not present

## 2015-11-18 DIAGNOSIS — M47817 Spondylosis without myelopathy or radiculopathy, lumbosacral region: Secondary | ICD-10-CM | POA: Diagnosis not present

## 2015-11-18 DIAGNOSIS — M47812 Spondylosis without myelopathy or radiculopathy, cervical region: Secondary | ICD-10-CM | POA: Diagnosis not present

## 2015-11-18 DIAGNOSIS — M9901 Segmental and somatic dysfunction of cervical region: Secondary | ICD-10-CM | POA: Diagnosis not present

## 2015-11-18 DIAGNOSIS — M9900 Segmental and somatic dysfunction of head region: Secondary | ICD-10-CM | POA: Diagnosis not present

## 2015-11-18 DIAGNOSIS — M5124 Other intervertebral disc displacement, thoracic region: Secondary | ICD-10-CM | POA: Diagnosis not present

## 2015-11-18 DIAGNOSIS — M9903 Segmental and somatic dysfunction of lumbar region: Secondary | ICD-10-CM | POA: Diagnosis not present

## 2015-11-19 DIAGNOSIS — F411 Generalized anxiety disorder: Secondary | ICD-10-CM | POA: Diagnosis not present

## 2015-11-28 DIAGNOSIS — R799 Abnormal finding of blood chemistry, unspecified: Secondary | ICD-10-CM | POA: Diagnosis not present

## 2015-11-28 DIAGNOSIS — R7989 Other specified abnormal findings of blood chemistry: Secondary | ICD-10-CM | POA: Diagnosis not present

## 2015-12-10 DIAGNOSIS — E1165 Type 2 diabetes mellitus with hyperglycemia: Secondary | ICD-10-CM | POA: Diagnosis not present

## 2015-12-17 DIAGNOSIS — M47812 Spondylosis without myelopathy or radiculopathy, cervical region: Secondary | ICD-10-CM | POA: Diagnosis not present

## 2015-12-17 DIAGNOSIS — M47817 Spondylosis without myelopathy or radiculopathy, lumbosacral region: Secondary | ICD-10-CM | POA: Diagnosis not present

## 2015-12-17 DIAGNOSIS — M9901 Segmental and somatic dysfunction of cervical region: Secondary | ICD-10-CM | POA: Diagnosis not present

## 2015-12-17 DIAGNOSIS — M9904 Segmental and somatic dysfunction of sacral region: Secondary | ICD-10-CM | POA: Diagnosis not present

## 2015-12-17 DIAGNOSIS — M9900 Segmental and somatic dysfunction of head region: Secondary | ICD-10-CM | POA: Diagnosis not present

## 2015-12-17 DIAGNOSIS — M5124 Other intervertebral disc displacement, thoracic region: Secondary | ICD-10-CM | POA: Diagnosis not present

## 2015-12-17 DIAGNOSIS — M9903 Segmental and somatic dysfunction of lumbar region: Secondary | ICD-10-CM | POA: Diagnosis not present

## 2015-12-20 DIAGNOSIS — J069 Acute upper respiratory infection, unspecified: Secondary | ICD-10-CM | POA: Diagnosis not present

## 2016-01-14 DIAGNOSIS — M9903 Segmental and somatic dysfunction of lumbar region: Secondary | ICD-10-CM | POA: Diagnosis not present

## 2016-01-14 DIAGNOSIS — M5124 Other intervertebral disc displacement, thoracic region: Secondary | ICD-10-CM | POA: Diagnosis not present

## 2016-01-14 DIAGNOSIS — M9900 Segmental and somatic dysfunction of head region: Secondary | ICD-10-CM | POA: Diagnosis not present

## 2016-01-14 DIAGNOSIS — M9901 Segmental and somatic dysfunction of cervical region: Secondary | ICD-10-CM | POA: Diagnosis not present

## 2016-01-14 DIAGNOSIS — M47817 Spondylosis without myelopathy or radiculopathy, lumbosacral region: Secondary | ICD-10-CM | POA: Diagnosis not present

## 2016-01-14 DIAGNOSIS — M47812 Spondylosis without myelopathy or radiculopathy, cervical region: Secondary | ICD-10-CM | POA: Diagnosis not present

## 2016-01-14 DIAGNOSIS — M9904 Segmental and somatic dysfunction of sacral region: Secondary | ICD-10-CM | POA: Diagnosis not present

## 2016-01-17 ENCOUNTER — Other Ambulatory Visit: Payer: Self-pay

## 2016-01-17 DIAGNOSIS — Z1231 Encounter for screening mammogram for malignant neoplasm of breast: Secondary | ICD-10-CM

## 2016-01-23 DIAGNOSIS — E1165 Type 2 diabetes mellitus with hyperglycemia: Secondary | ICD-10-CM | POA: Diagnosis not present

## 2016-01-23 DIAGNOSIS — L918 Other hypertrophic disorders of the skin: Secondary | ICD-10-CM | POA: Diagnosis not present

## 2016-01-28 DIAGNOSIS — C4491 Basal cell carcinoma of skin, unspecified: Secondary | ICD-10-CM | POA: Diagnosis not present

## 2016-02-08 IMAGING — US US ABDOMEN COMPLETE
1 series · 13 of 25 positions shown · non-contrast
Comparison: None.

CLINICAL DATA: Clinical history of an enlarged fatty infiltrated
liver

EXAM:
ULTRASOUND ABDOMEN COMPLETE

[Series 1: us abdomen complete · 0.30mm/px · 13 of 74 slices shown]
[im 1/74]
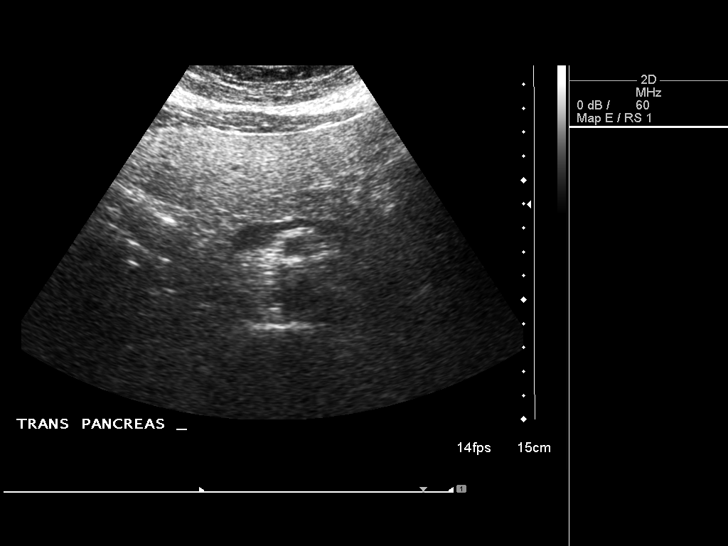
[im 7/74]
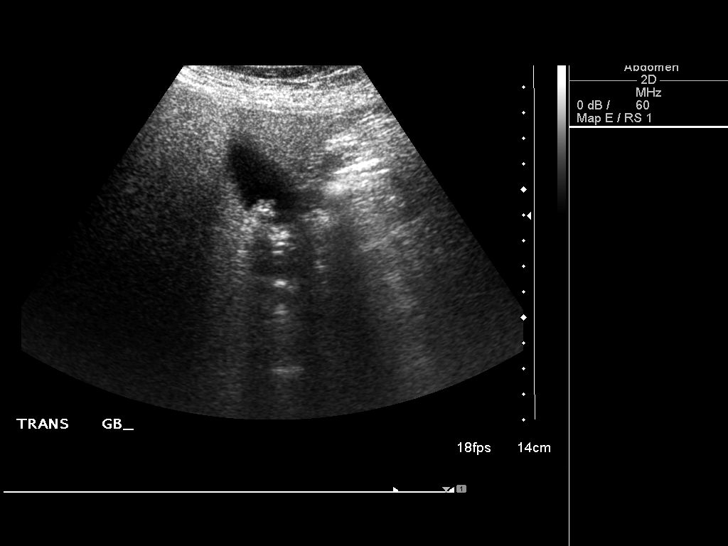
[im 13/74]
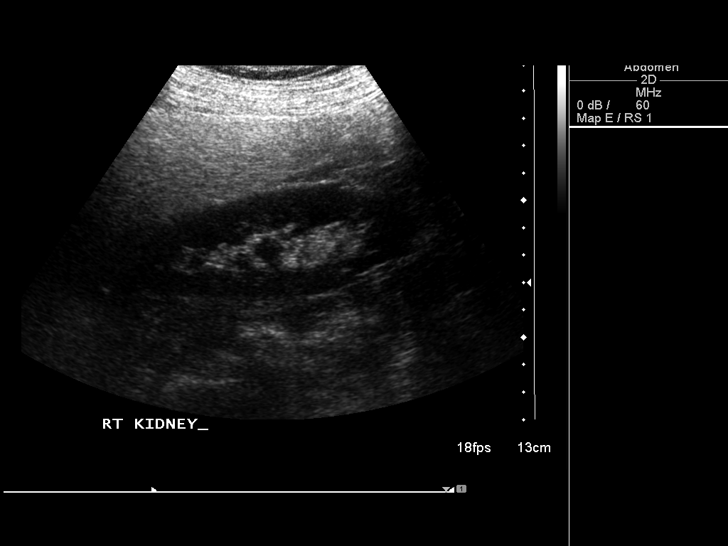
[im 19/74]
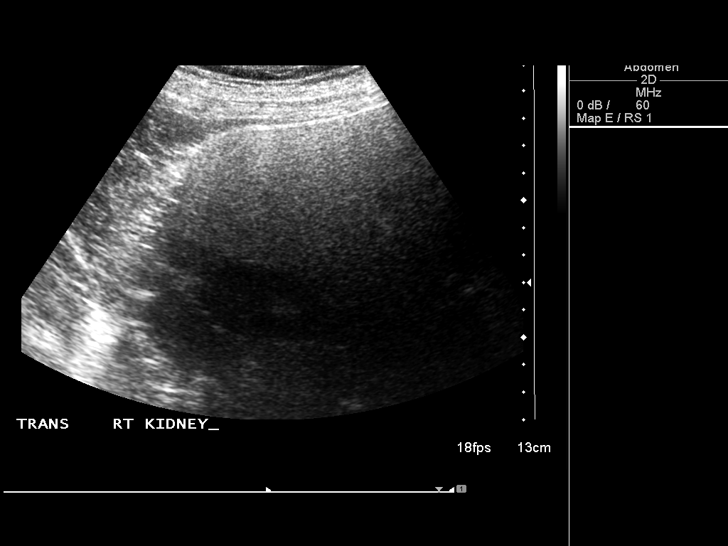
[im 25/74]
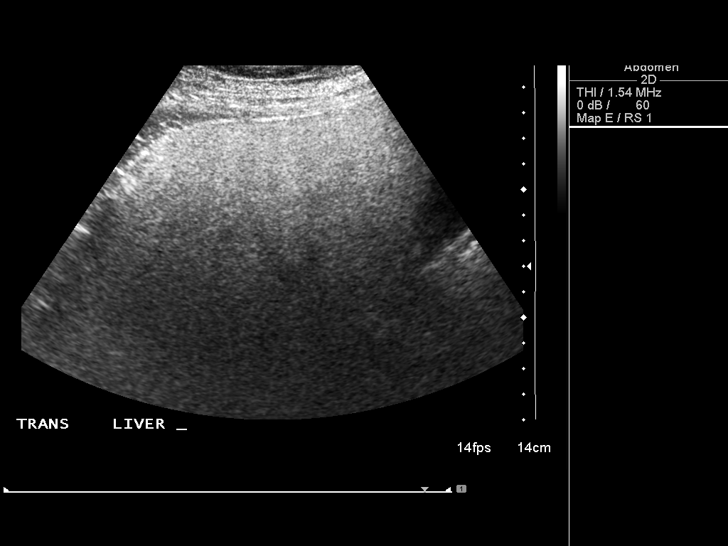
[im 31/74]
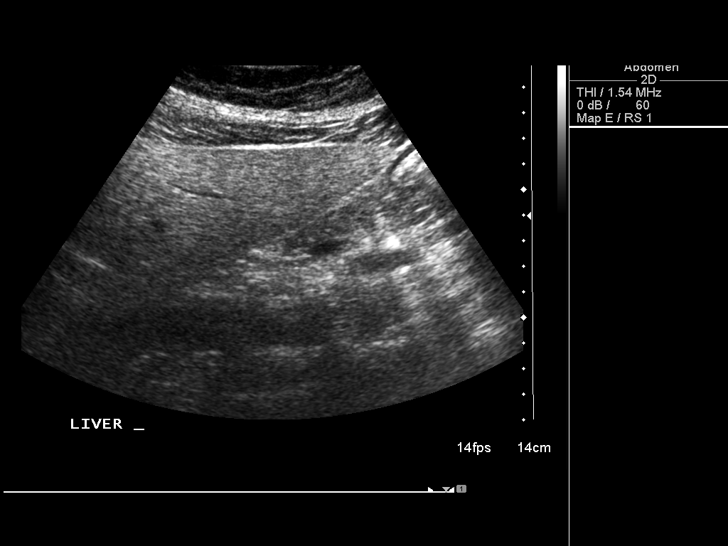
[im 37/74]
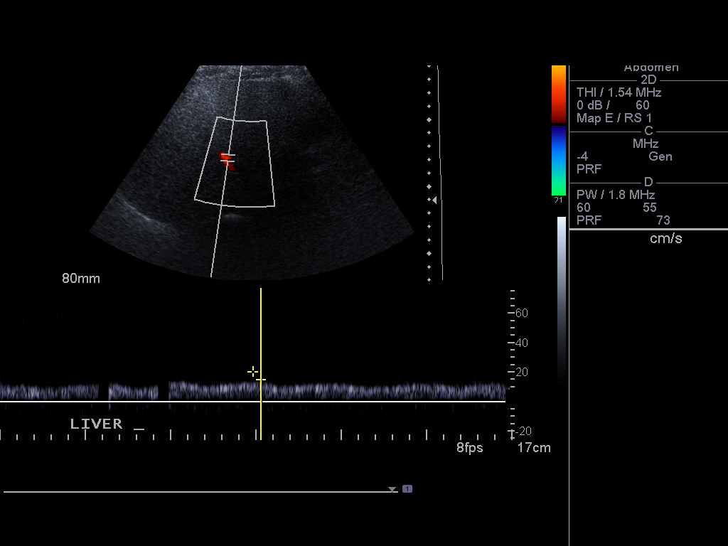
[im 43/74]
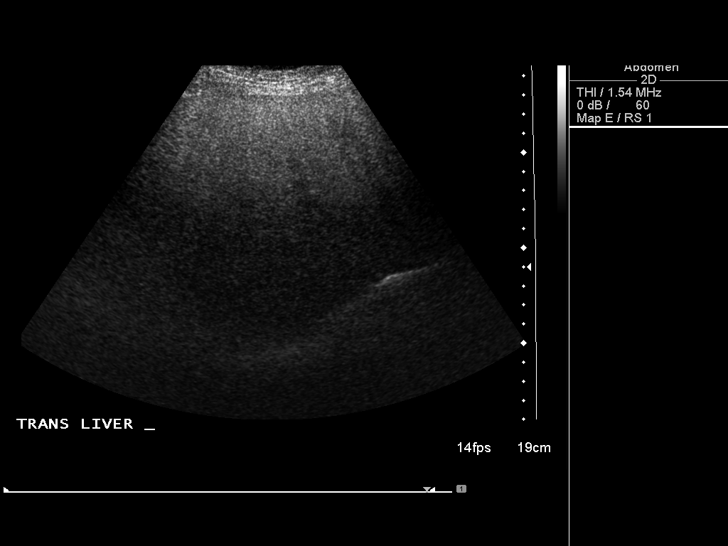
[im 49/74]
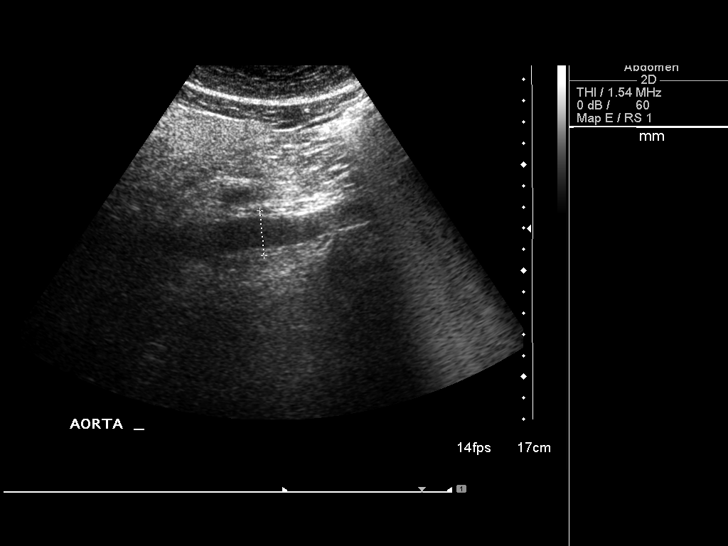
[im 55/74]
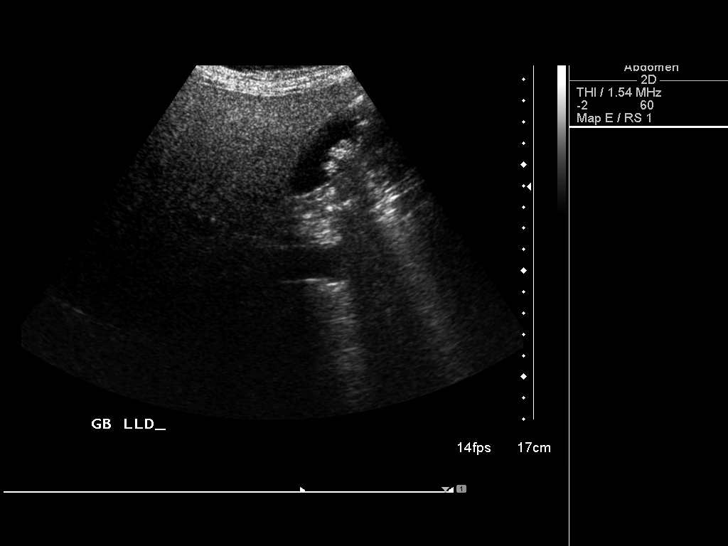
[im 61/74]
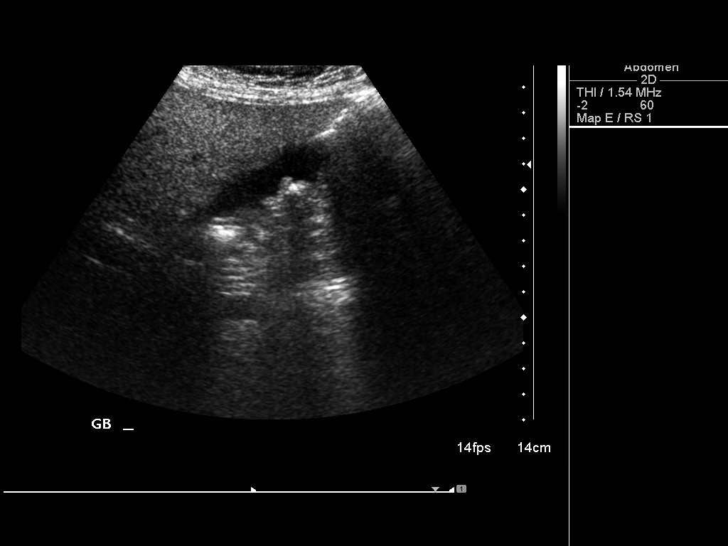
[im 67/74]
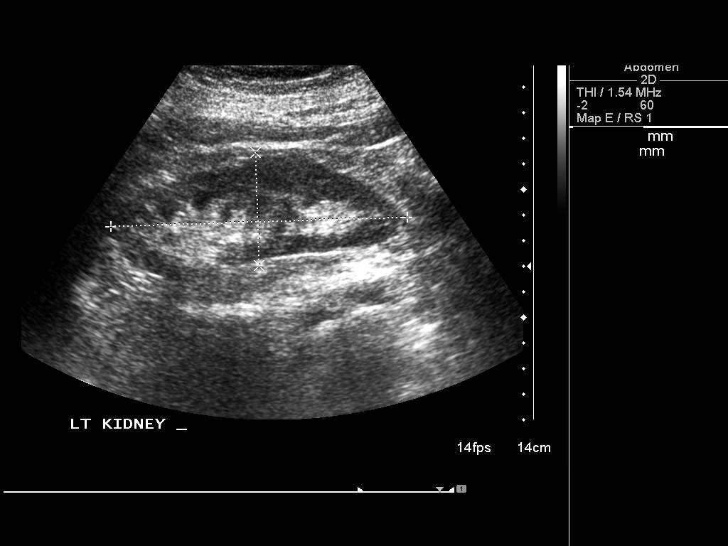
[im 74/74]
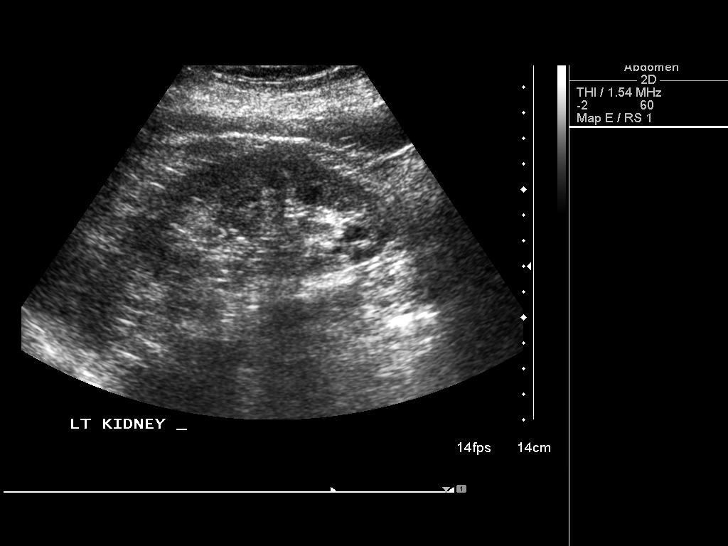

[13 of 25 positions shown; findings below may reference images not displayed]

FINDINGS: Gallbladder: The gallbladder is adequately distended. There are
multiple echogenic mobile shadowing stones. There is no gallbladder
wall thickening, pericholecystic fluid, or positive sonographic
Murphy's sign.

Common bile duct: Diameter: 4.2 mm

Liver: The liver is subjectively mildly enlarged. The echotexture is
increased. There is no focal mass or ductal dilation. The surface
contour is normal where visualized.

IVC: No abnormality visualized.

Pancreas: Visualized portion unremarkable.

Spleen: The spleen measures 10.4 x 4.0 x 12.1 cm with calculated
volume of 261 cc which is normal.

Right Kidney: Length: 11 cm. Echogenicity within normal limits. No
mass or hydronephrosis visualized.

Left Kidney: Length: 11.6 cm. Echogenicity within normal limits. No
mass or hydronephrosis visualized.

Abdominal aorta: No aneurysm visualized.

Other findings: None.
IMPRESSION: 1. Gallstones without evidence of acute cholecystitis.
2. Mild hepatic enlargement with echotexture changes consistent with
fatty infiltration. The spleen is normal in size.

## 2016-02-11 DIAGNOSIS — F411 Generalized anxiety disorder: Secondary | ICD-10-CM | POA: Diagnosis not present

## 2016-02-12 DIAGNOSIS — M5124 Other intervertebral disc displacement, thoracic region: Secondary | ICD-10-CM | POA: Diagnosis not present

## 2016-02-12 DIAGNOSIS — M9903 Segmental and somatic dysfunction of lumbar region: Secondary | ICD-10-CM | POA: Diagnosis not present

## 2016-02-12 DIAGNOSIS — M9904 Segmental and somatic dysfunction of sacral region: Secondary | ICD-10-CM | POA: Diagnosis not present

## 2016-02-12 DIAGNOSIS — M9901 Segmental and somatic dysfunction of cervical region: Secondary | ICD-10-CM | POA: Diagnosis not present

## 2016-02-12 DIAGNOSIS — M47812 Spondylosis without myelopathy or radiculopathy, cervical region: Secondary | ICD-10-CM | POA: Diagnosis not present

## 2016-02-12 DIAGNOSIS — M47817 Spondylosis without myelopathy or radiculopathy, lumbosacral region: Secondary | ICD-10-CM | POA: Diagnosis not present

## 2016-02-12 DIAGNOSIS — M9900 Segmental and somatic dysfunction of head region: Secondary | ICD-10-CM | POA: Diagnosis not present

## 2016-02-20 ENCOUNTER — Ambulatory Visit
Admission: RE | Admit: 2016-02-20 | Discharge: 2016-02-20 | Disposition: A | Discharge status: Home / Self Care | Payer: Medicare Other | Source: Ambulatory Visit

## 2016-02-20 DIAGNOSIS — H2511 Age-related nuclear cataract, right eye: Secondary | ICD-10-CM | POA: Diagnosis not present

## 2016-02-20 DIAGNOSIS — C6932 Malignant neoplasm of left choroid: Secondary | ICD-10-CM | POA: Diagnosis not present

## 2016-02-20 DIAGNOSIS — Z1231 Encounter for screening mammogram for malignant neoplasm of breast: Secondary | ICD-10-CM

## 2016-02-20 DIAGNOSIS — H35372 Puckering of macula, left eye: Secondary | ICD-10-CM | POA: Diagnosis not present

## 2016-02-20 DIAGNOSIS — Z961 Presence of intraocular lens: Secondary | ICD-10-CM | POA: Diagnosis not present

## 2016-02-20 DIAGNOSIS — E119 Type 2 diabetes mellitus without complications: Secondary | ICD-10-CM | POA: Diagnosis not present

## 2016-02-26 DIAGNOSIS — Z0000 Encounter for general adult medical examination without abnormal findings: Secondary | ICD-10-CM | POA: Diagnosis not present

## 2016-02-26 DIAGNOSIS — D559 Anemia due to enzyme disorder, unspecified: Secondary | ICD-10-CM | POA: Diagnosis not present

## 2016-02-26 DIAGNOSIS — E7800 Pure hypercholesterolemia, unspecified: Secondary | ICD-10-CM | POA: Diagnosis not present

## 2016-02-26 DIAGNOSIS — E039 Hypothyroidism, unspecified: Secondary | ICD-10-CM | POA: Diagnosis not present

## 2016-02-26 DIAGNOSIS — F429 Obsessive-compulsive disorder, unspecified: Secondary | ICD-10-CM | POA: Diagnosis not present

## 2016-02-26 DIAGNOSIS — E109 Type 1 diabetes mellitus without complications: Secondary | ICD-10-CM | POA: Diagnosis not present

## 2016-02-26 DIAGNOSIS — K51419 Inflammatory polyps of colon with unspecified complications: Secondary | ICD-10-CM | POA: Diagnosis not present

## 2016-02-26 DIAGNOSIS — E1165 Type 2 diabetes mellitus with hyperglycemia: Secondary | ICD-10-CM | POA: Diagnosis not present

## 2016-03-04 DIAGNOSIS — M9904 Segmental and somatic dysfunction of sacral region: Secondary | ICD-10-CM | POA: Diagnosis not present

## 2016-03-04 DIAGNOSIS — M47812 Spondylosis without myelopathy or radiculopathy, cervical region: Secondary | ICD-10-CM | POA: Diagnosis not present

## 2016-03-04 DIAGNOSIS — M9903 Segmental and somatic dysfunction of lumbar region: Secondary | ICD-10-CM | POA: Diagnosis not present

## 2016-03-04 DIAGNOSIS — M9900 Segmental and somatic dysfunction of head region: Secondary | ICD-10-CM | POA: Diagnosis not present

## 2016-03-04 DIAGNOSIS — M9901 Segmental and somatic dysfunction of cervical region: Secondary | ICD-10-CM | POA: Diagnosis not present

## 2016-03-04 DIAGNOSIS — M47817 Spondylosis without myelopathy or radiculopathy, lumbosacral region: Secondary | ICD-10-CM | POA: Diagnosis not present

## 2016-03-04 DIAGNOSIS — M5124 Other intervertebral disc displacement, thoracic region: Secondary | ICD-10-CM | POA: Diagnosis not present

## 2016-03-30 DIAGNOSIS — F411 Generalized anxiety disorder: Secondary | ICD-10-CM | POA: Diagnosis not present

## 2016-03-31 DIAGNOSIS — M5124 Other intervertebral disc displacement, thoracic region: Secondary | ICD-10-CM | POA: Diagnosis not present

## 2016-03-31 DIAGNOSIS — M47817 Spondylosis without myelopathy or radiculopathy, lumbosacral region: Secondary | ICD-10-CM | POA: Diagnosis not present

## 2016-03-31 DIAGNOSIS — M47812 Spondylosis without myelopathy or radiculopathy, cervical region: Secondary | ICD-10-CM | POA: Diagnosis not present

## 2016-03-31 DIAGNOSIS — M9900 Segmental and somatic dysfunction of head region: Secondary | ICD-10-CM | POA: Diagnosis not present

## 2016-03-31 DIAGNOSIS — M9904 Segmental and somatic dysfunction of sacral region: Secondary | ICD-10-CM | POA: Diagnosis not present

## 2016-03-31 DIAGNOSIS — M9903 Segmental and somatic dysfunction of lumbar region: Secondary | ICD-10-CM | POA: Diagnosis not present

## 2016-03-31 DIAGNOSIS — M9901 Segmental and somatic dysfunction of cervical region: Secondary | ICD-10-CM | POA: Diagnosis not present

## 2016-04-23 DIAGNOSIS — H01004 Unspecified blepharitis left upper eyelid: Secondary | ICD-10-CM | POA: Diagnosis not present

## 2016-04-23 DIAGNOSIS — H01001 Unspecified blepharitis right upper eyelid: Secondary | ICD-10-CM | POA: Diagnosis not present

## 2016-04-23 DIAGNOSIS — H1013 Acute atopic conjunctivitis, bilateral: Secondary | ICD-10-CM | POA: Diagnosis not present

## 2016-04-28 DIAGNOSIS — M9901 Segmental and somatic dysfunction of cervical region: Secondary | ICD-10-CM | POA: Diagnosis not present

## 2016-04-28 DIAGNOSIS — M5124 Other intervertebral disc displacement, thoracic region: Secondary | ICD-10-CM | POA: Diagnosis not present

## 2016-04-28 DIAGNOSIS — M9900 Segmental and somatic dysfunction of head region: Secondary | ICD-10-CM | POA: Diagnosis not present

## 2016-04-28 DIAGNOSIS — M9904 Segmental and somatic dysfunction of sacral region: Secondary | ICD-10-CM | POA: Diagnosis not present

## 2016-04-28 DIAGNOSIS — M47817 Spondylosis without myelopathy or radiculopathy, lumbosacral region: Secondary | ICD-10-CM | POA: Diagnosis not present

## 2016-04-28 DIAGNOSIS — M47812 Spondylosis without myelopathy or radiculopathy, cervical region: Secondary | ICD-10-CM | POA: Diagnosis not present

## 2016-04-28 DIAGNOSIS — M9903 Segmental and somatic dysfunction of lumbar region: Secondary | ICD-10-CM | POA: Diagnosis not present

## 2016-04-29 DIAGNOSIS — F0390 Unspecified dementia without behavioral disturbance: Secondary | ICD-10-CM | POA: Diagnosis not present

## 2016-04-29 DIAGNOSIS — D510 Vitamin B12 deficiency anemia due to intrinsic factor deficiency: Secondary | ICD-10-CM | POA: Diagnosis not present

## 2016-04-29 DIAGNOSIS — R799 Abnormal finding of blood chemistry, unspecified: Secondary | ICD-10-CM | POA: Diagnosis not present

## 2016-05-01 ENCOUNTER — Other Ambulatory Visit: Payer: Self-pay | Admitting: Internal Medicine

## 2016-05-01 DIAGNOSIS — F0390 Unspecified dementia without behavioral disturbance: Secondary | ICD-10-CM

## 2016-05-10 ENCOUNTER — Ambulatory Visit
Admission: RE | Admit: 2016-05-10 | Discharge: 2016-05-10 | Disposition: A | Discharge status: Home / Self Care | Payer: Medicare Other | Source: Ambulatory Visit | Attending: Internal Medicine | Admitting: Internal Medicine

## 2016-05-10 DIAGNOSIS — R410 Disorientation, unspecified: Secondary | ICD-10-CM | POA: Diagnosis not present

## 2016-05-10 DIAGNOSIS — F0390 Unspecified dementia without behavioral disturbance: Secondary | ICD-10-CM

## 2016-05-10 MED ORDER — GADOBENATE DIMEGLUMINE 529 MG/ML IV SOLN: 14.0000 mL | Freq: Once | INTRAVENOUS | Status: DC | PRN

## 2016-05-12 DIAGNOSIS — F411 Generalized anxiety disorder: Secondary | ICD-10-CM | POA: Diagnosis not present

## 2016-05-19 DIAGNOSIS — F411 Generalized anxiety disorder: Secondary | ICD-10-CM | POA: Diagnosis not present

## 2016-05-22 DIAGNOSIS — F0390 Unspecified dementia without behavioral disturbance: Secondary | ICD-10-CM | POA: Diagnosis not present

## 2016-05-26 DIAGNOSIS — M47812 Spondylosis without myelopathy or radiculopathy, cervical region: Secondary | ICD-10-CM | POA: Diagnosis not present

## 2016-05-26 DIAGNOSIS — M9904 Segmental and somatic dysfunction of sacral region: Secondary | ICD-10-CM | POA: Diagnosis not present

## 2016-05-26 DIAGNOSIS — M9900 Segmental and somatic dysfunction of head region: Secondary | ICD-10-CM | POA: Diagnosis not present

## 2016-05-26 DIAGNOSIS — M9903 Segmental and somatic dysfunction of lumbar region: Secondary | ICD-10-CM | POA: Diagnosis not present

## 2016-05-26 DIAGNOSIS — M5124 Other intervertebral disc displacement, thoracic region: Secondary | ICD-10-CM | POA: Diagnosis not present

## 2016-05-26 DIAGNOSIS — M47817 Spondylosis without myelopathy or radiculopathy, lumbosacral region: Secondary | ICD-10-CM | POA: Diagnosis not present

## 2016-05-26 DIAGNOSIS — M9901 Segmental and somatic dysfunction of cervical region: Secondary | ICD-10-CM | POA: Diagnosis not present

## 2016-06-02 DIAGNOSIS — L259 Unspecified contact dermatitis, unspecified cause: Secondary | ICD-10-CM | POA: Diagnosis not present

## 2016-06-05 DIAGNOSIS — E1165 Type 2 diabetes mellitus with hyperglycemia: Secondary | ICD-10-CM | POA: Diagnosis not present

## 2016-06-05 DIAGNOSIS — T63424A Toxic effect of venom of ants, undetermined, initial encounter: Secondary | ICD-10-CM | POA: Diagnosis not present

## 2016-06-08 DIAGNOSIS — F411 Generalized anxiety disorder: Secondary | ICD-10-CM | POA: Diagnosis not present

## 2016-06-16 ENCOUNTER — Other Ambulatory Visit: Payer: Self-pay

## 2016-06-16 DIAGNOSIS — Z23 Encounter for immunization: Secondary | ICD-10-CM | POA: Diagnosis not present

## 2016-06-19 DIAGNOSIS — E1165 Type 2 diabetes mellitus with hyperglycemia: Secondary | ICD-10-CM | POA: Diagnosis not present

## 2016-06-19 DIAGNOSIS — F419 Anxiety disorder, unspecified: Secondary | ICD-10-CM | POA: Diagnosis not present

## 2016-07-01 DIAGNOSIS — M5124 Other intervertebral disc displacement, thoracic region: Secondary | ICD-10-CM | POA: Diagnosis not present

## 2016-07-01 DIAGNOSIS — M9901 Segmental and somatic dysfunction of cervical region: Secondary | ICD-10-CM | POA: Diagnosis not present

## 2016-07-01 DIAGNOSIS — M47812 Spondylosis without myelopathy or radiculopathy, cervical region: Secondary | ICD-10-CM | POA: Diagnosis not present

## 2016-07-01 DIAGNOSIS — F411 Generalized anxiety disorder: Secondary | ICD-10-CM | POA: Diagnosis not present

## 2016-07-01 DIAGNOSIS — M9904 Segmental and somatic dysfunction of sacral region: Secondary | ICD-10-CM | POA: Diagnosis not present

## 2016-07-01 DIAGNOSIS — M9900 Segmental and somatic dysfunction of head region: Secondary | ICD-10-CM | POA: Diagnosis not present

## 2016-07-01 DIAGNOSIS — M9903 Segmental and somatic dysfunction of lumbar region: Secondary | ICD-10-CM | POA: Diagnosis not present

## 2016-07-01 DIAGNOSIS — M47817 Spondylosis without myelopathy or radiculopathy, lumbosacral region: Secondary | ICD-10-CM | POA: Diagnosis not present

## 2016-09-09 DIAGNOSIS — M9904 Segmental and somatic dysfunction of sacral region: Secondary | ICD-10-CM | POA: Diagnosis not present

## 2016-09-09 DIAGNOSIS — M9903 Segmental and somatic dysfunction of lumbar region: Secondary | ICD-10-CM | POA: Diagnosis not present

## 2016-09-09 DIAGNOSIS — M9905 Segmental and somatic dysfunction of pelvic region: Secondary | ICD-10-CM | POA: Diagnosis not present

## 2016-09-09 DIAGNOSIS — M5137 Other intervertebral disc degeneration, lumbosacral region: Secondary | ICD-10-CM | POA: Diagnosis not present

## 2016-09-14 DIAGNOSIS — M9903 Segmental and somatic dysfunction of lumbar region: Secondary | ICD-10-CM | POA: Diagnosis not present

## 2016-09-14 DIAGNOSIS — M9900 Segmental and somatic dysfunction of head region: Secondary | ICD-10-CM | POA: Diagnosis not present

## 2016-09-14 DIAGNOSIS — M47812 Spondylosis without myelopathy or radiculopathy, cervical region: Secondary | ICD-10-CM | POA: Diagnosis not present

## 2016-09-14 DIAGNOSIS — M5124 Other intervertebral disc displacement, thoracic region: Secondary | ICD-10-CM | POA: Diagnosis not present

## 2016-09-14 DIAGNOSIS — M47817 Spondylosis without myelopathy or radiculopathy, lumbosacral region: Secondary | ICD-10-CM | POA: Diagnosis not present

## 2016-09-14 DIAGNOSIS — M9901 Segmental and somatic dysfunction of cervical region: Secondary | ICD-10-CM | POA: Diagnosis not present

## 2016-09-14 DIAGNOSIS — M9904 Segmental and somatic dysfunction of sacral region: Secondary | ICD-10-CM | POA: Diagnosis not present

## 2017-02-18 DIAGNOSIS — R440 Auditory hallucinations: Secondary | ICD-10-CM | POA: Diagnosis not present

## 2017-02-18 DIAGNOSIS — F418 Other specified anxiety disorders: Secondary | ICD-10-CM | POA: Diagnosis not present

## 2017-02-18 DIAGNOSIS — F429 Obsessive-compulsive disorder, unspecified: Secondary | ICD-10-CM | POA: Diagnosis not present

## 2017-02-18 DIAGNOSIS — E119 Type 2 diabetes mellitus without complications: Secondary | ICD-10-CM | POA: Diagnosis not present

## 2017-02-18 DIAGNOSIS — Z6824 Body mass index (BMI) 24.0-24.9, adult: Secondary | ICD-10-CM | POA: Diagnosis not present

## 2017-03-01 DIAGNOSIS — F4329 Adjustment disorder with other symptoms: Secondary | ICD-10-CM | POA: Diagnosis not present

## 2017-03-11 DIAGNOSIS — Z0000 Encounter for general adult medical examination without abnormal findings: Secondary | ICD-10-CM | POA: Diagnosis not present

## 2017-03-11 DIAGNOSIS — Z13220 Encounter for screening for lipoid disorders: Secondary | ICD-10-CM | POA: Diagnosis not present

## 2017-03-11 DIAGNOSIS — E559 Vitamin D deficiency, unspecified: Secondary | ICD-10-CM | POA: Diagnosis not present

## 2017-03-11 DIAGNOSIS — Z1329 Encounter for screening for other suspected endocrine disorder: Secondary | ICD-10-CM | POA: Diagnosis not present

## 2017-03-15 DIAGNOSIS — F4329 Adjustment disorder with other symptoms: Secondary | ICD-10-CM | POA: Diagnosis not present

## 2017-03-19 DIAGNOSIS — E119 Type 2 diabetes mellitus without complications: Secondary | ICD-10-CM | POA: Diagnosis not present

## 2017-03-19 DIAGNOSIS — Z0001 Encounter for general adult medical examination with abnormal findings: Secondary | ICD-10-CM | POA: Diagnosis not present

## 2017-03-19 DIAGNOSIS — Z6824 Body mass index (BMI) 24.0-24.9, adult: Secondary | ICD-10-CM | POA: Diagnosis not present

## 2017-03-19 DIAGNOSIS — R413 Other amnesia: Secondary | ICD-10-CM | POA: Diagnosis not present

## 2017-04-16 IMAGING — MR MR HEAD WO/W CM
12 series · 48 of 48 positions shown · IV contrast (multihance)
Comparison: None.

CLINICAL DATA: Memory loss, dementia, and confusion with worsening
for 2 years. Remote history of melanoma.

EXAM:
MRI HEAD WITHOUT AND WITH CONTRAST
TECHNIQUE: Multiplanar, multiecho pulse sequences of the brain and surrounding
structures were obtained without and with intravenous contrast.
CONTRAST:  MultiHance 14 mL.
Creatinine was obtained on site at [HOSPITAL] at [HOSPITAL].
Results: Creatinine 1.1 mg/dL.

[Series 5: T1 · sagittal · 4.0mm · 0.75mm/px · 1 of 29 slices shown (1 of 3)]
[im 1/29]
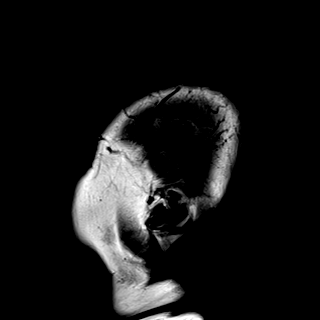

[Series 6: T2 · axial · 4.0mm · 0.36mm/px · 1 of 28 slices shown]
[im 1/28]
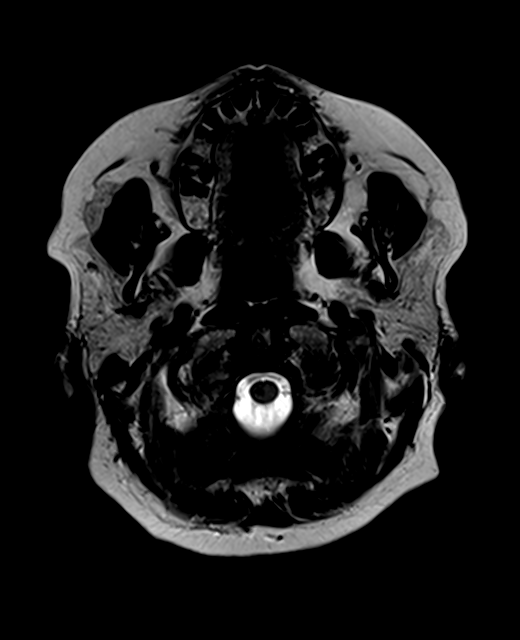

[Series 7: DWI · axial · 3.0mm · 1.44mm/px · z∈[-80,+61]mm · 6 of 88 slices shown (1 of 4)]
[im 1/88]
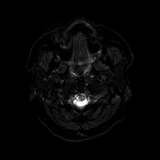
[im 18/88]
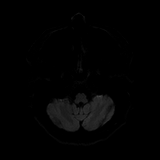
[im 35/88]
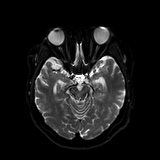
[im 53/88]
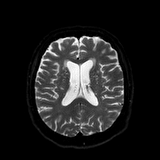
[im 70/88]
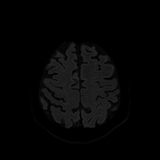
[im 88/88]
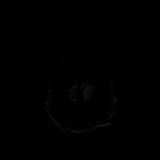

[Series 8: DWI · axial · 3.0mm · 1.44mm/px · z∈[-80,+61]mm · 3 of 44 slices shown (2 of 4)]
[im 1/44]
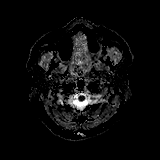
[im 22/44]
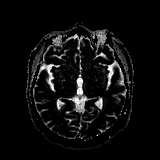
[im 44/44]
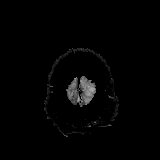

[Series 9: DWI · coronal · 5.0mm · 1.44mm/px · 4 of 56 slices shown (3 of 4)]
[im 1/56]
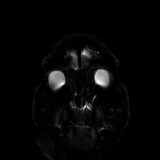
[im 19/56]
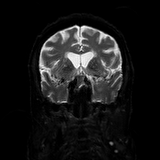
[im 37/56]
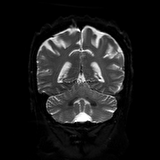
[im 56/56]
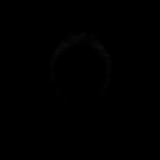

[Series 10: DWI · coronal · 5.0mm · 1.44mm/px · 2 of 28 slices shown (4 of 4)]
[im 1/28]
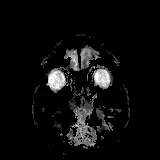
[im 28/28]
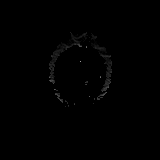

[Series 11: FLAIR · axial · 4.0mm · 0.72mm/px · z∈[-80,+60]mm · 2 of 28 slices shown]
[im 1/28]
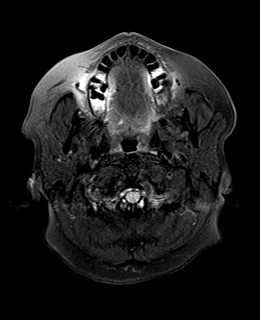
[im 28/28]
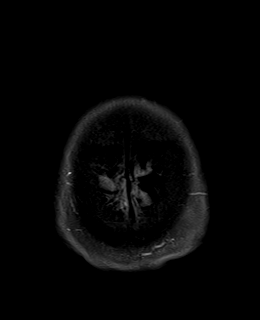

[Series 13: swi_images · axial · 2.0mm · 0.90mm/px · z∈[-78,+64]mm · 5 of 72 slices shown]
[im 1/72]
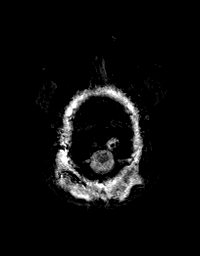
[im 18/72]
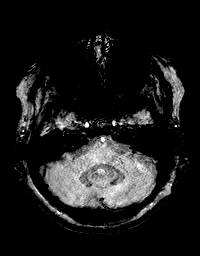
[im 36/72]
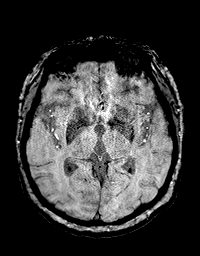
[im 54/72]
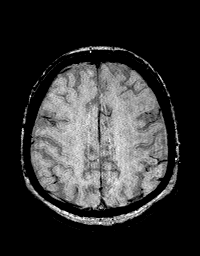
[im 72/72]
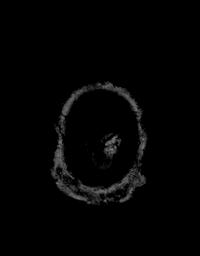

[Series 14: T1 · axial · 1.0mm · 0.90mm/px · z∈[-80,+62]mm · 10 of 144 slices shown (2 of 3)]
[im 1/144]
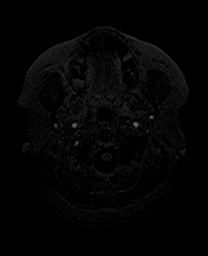
[im 16/144]
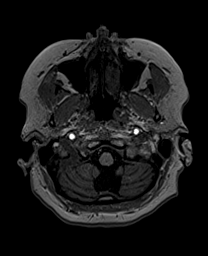
[im 32/144]
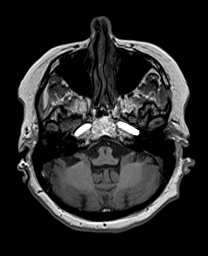
[im 48/144]
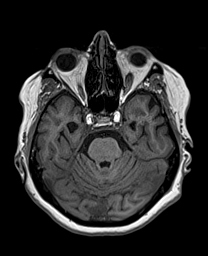
[im 64/144]
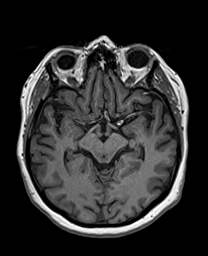
[im 80/144]
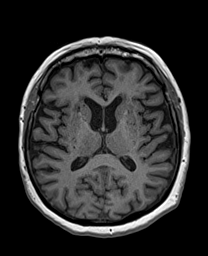
[im 96/144]
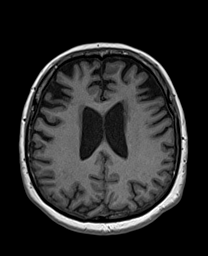
[im 112/144]
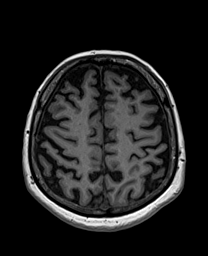
[im 128/144]
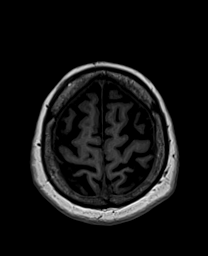
[im 144/144]
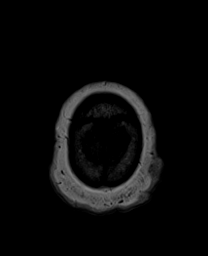

[Series 15: T2 post-contrast · coronal · 4.0mm · 0.36mm/px · 2 of 30 slices shown]
[im 1/30]
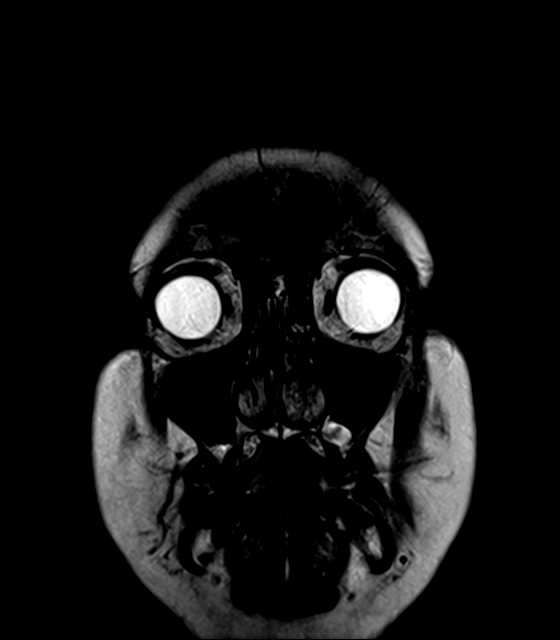
[im 30/30]
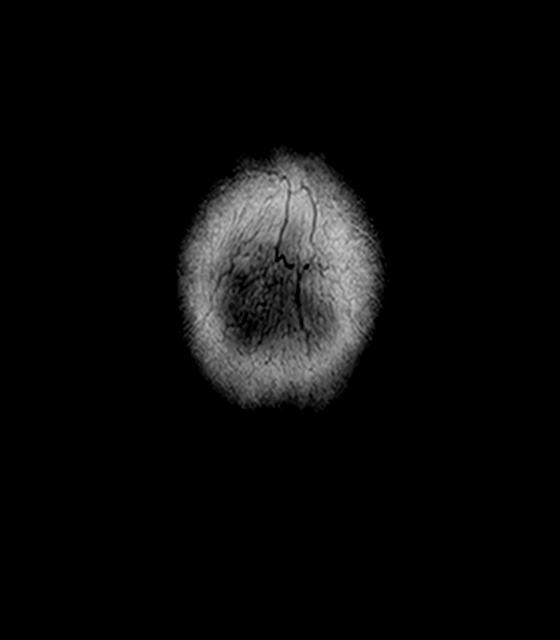

[Series 16: T1 · axial · 1.0mm · 0.90mm/px · z∈[-80,+62]mm · 10 of 144 slices shown (3 of 3)]
[im 1/144]
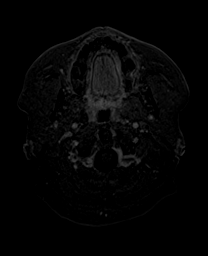
[im 16/144]
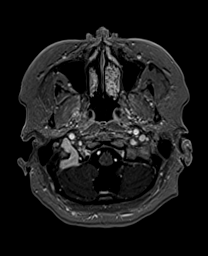
[im 32/144]
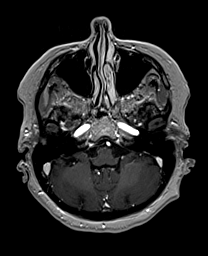
[im 48/144]
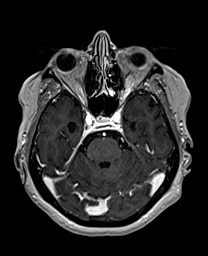
[im 64/144]
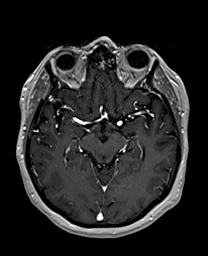
[im 80/144]
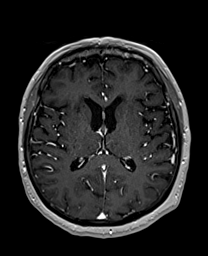
[im 96/144]
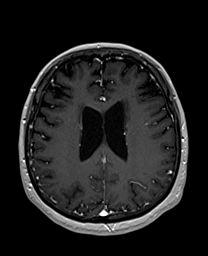
[im 112/144]
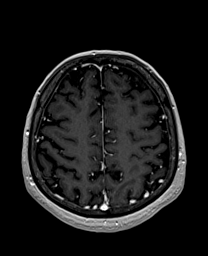
[im 128/144]
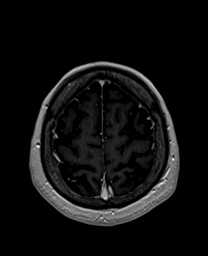
[im 144/144]
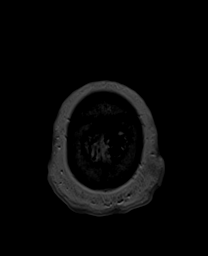

[Series 17: T1 post-contrast · coronal · 4.0mm · 0.72mm/px · 2 of 30 slices shown]
[im 1/30]
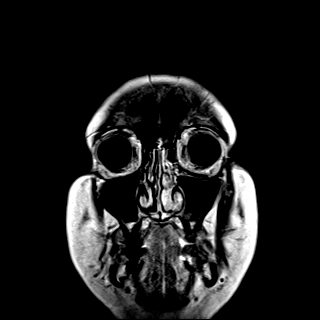
[im 30/30]
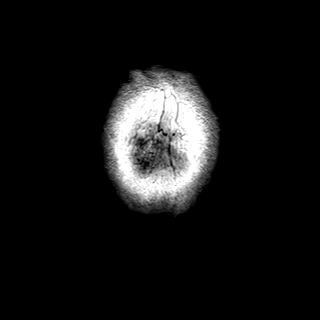

[48 of 48 positions shown; findings below may reference images not displayed]

FINDINGS: No evidence for acute infarction, hemorrhage, mass lesion, or
extra-axial fluid. The global atrophy. Hydrocephalus ex vacuo.
Moderate to advanced T2 and FLAIR hyperintensities throughout the
white matter, likely chronic microvascular ischemic change. Multiple
small chronic lacunar infarcts are scattered throughout the basal
ganglia and deep white matter.

Flow voids are maintained throughout the carotid, basilar, and
vertebral arteries. There are no areas of chronic hemorrhage. RIGHT
vertebral dominant. No areas of susceptibility to suggest melanoma
to the brain.

Pituitary, pineal, and cerebellar tonsils unremarkable. No upper
cervical lesions.

Post infusion, no abnormal enhancement of the brain or meninges.

Visualized calvarium, skull base, and upper cervical osseous
structures unremarkable. Scalp and extracranial soft tissues,
orbits, sinuses, and mastoids show no acute process. LEFT cataract
extraction.
IMPRESSION: Global atrophy. Moderate to advanced chronic microvascular ischemic
change.

No acute intracranial findings.

No abnormal postcontrast enhancement.

## 2017-04-29 DIAGNOSIS — F419 Anxiety disorder, unspecified: Secondary | ICD-10-CM | POA: Diagnosis not present

## 2017-04-29 DIAGNOSIS — E039 Hypothyroidism, unspecified: Secondary | ICD-10-CM | POA: Diagnosis not present

## 2017-04-29 DIAGNOSIS — K635 Polyp of colon: Secondary | ICD-10-CM | POA: Diagnosis not present

## 2017-04-29 DIAGNOSIS — I10 Essential (primary) hypertension: Secondary | ICD-10-CM | POA: Diagnosis not present

## 2017-04-29 DIAGNOSIS — E119 Type 2 diabetes mellitus without complications: Secondary | ICD-10-CM | POA: Diagnosis not present

## 2017-06-17 DIAGNOSIS — F0390 Unspecified dementia without behavioral disturbance: Secondary | ICD-10-CM | POA: Diagnosis not present

## 2017-06-17 DIAGNOSIS — E119 Type 2 diabetes mellitus without complications: Secondary | ICD-10-CM | POA: Diagnosis not present

## 2017-06-17 DIAGNOSIS — I10 Essential (primary) hypertension: Secondary | ICD-10-CM | POA: Diagnosis not present

## 2017-06-17 DIAGNOSIS — Z6823 Body mass index (BMI) 23.0-23.9, adult: Secondary | ICD-10-CM | POA: Diagnosis not present

## 2017-06-17 DIAGNOSIS — E039 Hypothyroidism, unspecified: Secondary | ICD-10-CM | POA: Diagnosis not present

## 2017-06-21 DIAGNOSIS — G4709 Other insomnia: Secondary | ICD-10-CM | POA: Diagnosis not present

## 2017-06-21 DIAGNOSIS — F324 Major depressive disorder, single episode, in partial remission: Secondary | ICD-10-CM | POA: Diagnosis not present

## 2017-06-21 DIAGNOSIS — R4689 Other symptoms and signs involving appearance and behavior: Secondary | ICD-10-CM | POA: Diagnosis not present

## 2017-06-22 DIAGNOSIS — E038 Other specified hypothyroidism: Secondary | ICD-10-CM | POA: Diagnosis not present

## 2017-06-22 DIAGNOSIS — E119 Type 2 diabetes mellitus without complications: Secondary | ICD-10-CM | POA: Diagnosis not present

## 2017-06-22 DIAGNOSIS — E559 Vitamin D deficiency, unspecified: Secondary | ICD-10-CM | POA: Diagnosis not present

## 2017-06-22 DIAGNOSIS — E782 Mixed hyperlipidemia: Secondary | ICD-10-CM | POA: Diagnosis not present

## 2017-06-22 DIAGNOSIS — D518 Other vitamin B12 deficiency anemias: Secondary | ICD-10-CM | POA: Diagnosis not present

## 2017-06-22 DIAGNOSIS — Z79899 Other long term (current) drug therapy: Secondary | ICD-10-CM | POA: Diagnosis not present

## 2017-06-29 DIAGNOSIS — R4689 Other symptoms and signs involving appearance and behavior: Secondary | ICD-10-CM | POA: Diagnosis not present

## 2017-06-29 DIAGNOSIS — F324 Major depressive disorder, single episode, in partial remission: Secondary | ICD-10-CM | POA: Diagnosis not present

## 2017-06-29 DIAGNOSIS — G4709 Other insomnia: Secondary | ICD-10-CM | POA: Diagnosis not present

## 2017-07-05 DIAGNOSIS — G3184 Mild cognitive impairment, so stated: Secondary | ICD-10-CM | POA: Diagnosis not present

## 2017-07-05 DIAGNOSIS — F324 Major depressive disorder, single episode, in partial remission: Secondary | ICD-10-CM | POA: Diagnosis not present

## 2017-07-05 DIAGNOSIS — R4689 Other symptoms and signs involving appearance and behavior: Secondary | ICD-10-CM | POA: Diagnosis not present

## 2017-07-05 DIAGNOSIS — G4709 Other insomnia: Secondary | ICD-10-CM | POA: Diagnosis not present

## 2017-07-09 DIAGNOSIS — Z23 Encounter for immunization: Secondary | ICD-10-CM | POA: Diagnosis not present

## 2017-07-14 DIAGNOSIS — G3184 Mild cognitive impairment, so stated: Secondary | ICD-10-CM | POA: Diagnosis not present

## 2017-07-14 DIAGNOSIS — F324 Major depressive disorder, single episode, in partial remission: Secondary | ICD-10-CM | POA: Diagnosis not present

## 2017-07-14 DIAGNOSIS — Z658 Other specified problems related to psychosocial circumstances: Secondary | ICD-10-CM | POA: Diagnosis not present

## 2017-07-15 DIAGNOSIS — G3184 Mild cognitive impairment, so stated: Secondary | ICD-10-CM | POA: Diagnosis not present

## 2017-07-15 DIAGNOSIS — R4689 Other symptoms and signs involving appearance and behavior: Secondary | ICD-10-CM | POA: Diagnosis not present

## 2017-07-15 DIAGNOSIS — F324 Major depressive disorder, single episode, in partial remission: Secondary | ICD-10-CM | POA: Diagnosis not present

## 2017-08-03 DIAGNOSIS — E119 Type 2 diabetes mellitus without complications: Secondary | ICD-10-CM | POA: Diagnosis not present

## 2017-08-03 DIAGNOSIS — L609 Nail disorder, unspecified: Secondary | ICD-10-CM | POA: Diagnosis not present

## 2017-08-13 DIAGNOSIS — G4709 Other insomnia: Secondary | ICD-10-CM | POA: Diagnosis not present

## 2017-08-13 DIAGNOSIS — G3184 Mild cognitive impairment, so stated: Secondary | ICD-10-CM | POA: Diagnosis not present

## 2017-08-13 DIAGNOSIS — F341 Dysthymic disorder: Secondary | ICD-10-CM | POA: Diagnosis not present

## 2017-09-10 DIAGNOSIS — G4709 Other insomnia: Secondary | ICD-10-CM | POA: Diagnosis not present

## 2017-09-10 DIAGNOSIS — F341 Dysthymic disorder: Secondary | ICD-10-CM | POA: Diagnosis not present

## 2017-09-10 DIAGNOSIS — F0391 Unspecified dementia with behavioral disturbance: Secondary | ICD-10-CM | POA: Diagnosis not present

## 2017-10-14 DIAGNOSIS — N183 Chronic kidney disease, stage 3 (moderate): Secondary | ICD-10-CM | POA: Diagnosis not present

## 2017-10-14 DIAGNOSIS — F0391 Unspecified dementia with behavioral disturbance: Secondary | ICD-10-CM | POA: Diagnosis not present

## 2017-10-14 DIAGNOSIS — E039 Hypothyroidism, unspecified: Secondary | ICD-10-CM | POA: Diagnosis not present

## 2017-10-14 DIAGNOSIS — E1122 Type 2 diabetes mellitus with diabetic chronic kidney disease: Secondary | ICD-10-CM | POA: Diagnosis not present

## 2017-10-14 DIAGNOSIS — I129 Hypertensive chronic kidney disease with stage 1 through stage 4 chronic kidney disease, or unspecified chronic kidney disease: Secondary | ICD-10-CM | POA: Diagnosis not present

## 2017-10-21 DIAGNOSIS — F324 Major depressive disorder, single episode, in partial remission: Secondary | ICD-10-CM | POA: Diagnosis not present

## 2017-10-21 DIAGNOSIS — F0391 Unspecified dementia with behavioral disturbance: Secondary | ICD-10-CM | POA: Diagnosis not present

## 2017-11-16 DIAGNOSIS — L609 Nail disorder, unspecified: Secondary | ICD-10-CM | POA: Diagnosis not present

## 2017-11-16 DIAGNOSIS — E119 Type 2 diabetes mellitus without complications: Secondary | ICD-10-CM | POA: Diagnosis not present

## 2017-11-24 DIAGNOSIS — J111 Influenza due to unidentified influenza virus with other respiratory manifestations: Secondary | ICD-10-CM | POA: Diagnosis not present

## 2017-11-25 DIAGNOSIS — F0391 Unspecified dementia with behavioral disturbance: Secondary | ICD-10-CM | POA: Diagnosis not present

## 2017-11-25 DIAGNOSIS — E119 Type 2 diabetes mellitus without complications: Secondary | ICD-10-CM | POA: Diagnosis not present

## 2017-11-26 DIAGNOSIS — Z79899 Other long term (current) drug therapy: Secondary | ICD-10-CM | POA: Diagnosis not present

## 2017-11-26 DIAGNOSIS — E119 Type 2 diabetes mellitus without complications: Secondary | ICD-10-CM | POA: Diagnosis not present

## 2017-12-08 DIAGNOSIS — Z658 Other specified problems related to psychosocial circumstances: Secondary | ICD-10-CM | POA: Diagnosis not present

## 2017-12-08 DIAGNOSIS — F324 Major depressive disorder, single episode, in partial remission: Secondary | ICD-10-CM | POA: Diagnosis not present

## 2017-12-08 DIAGNOSIS — F0391 Unspecified dementia with behavioral disturbance: Secondary | ICD-10-CM | POA: Diagnosis not present

## 2018-01-06 DIAGNOSIS — G20 Parkinson's disease: Secondary | ICD-10-CM | POA: Diagnosis not present

## 2018-01-06 DIAGNOSIS — F0391 Unspecified dementia with behavioral disturbance: Secondary | ICD-10-CM | POA: Diagnosis not present

## 2018-01-06 DIAGNOSIS — N183 Chronic kidney disease, stage 3 (moderate): Secondary | ICD-10-CM | POA: Diagnosis not present

## 2018-01-06 DIAGNOSIS — I129 Hypertensive chronic kidney disease with stage 1 through stage 4 chronic kidney disease, or unspecified chronic kidney disease: Secondary | ICD-10-CM | POA: Diagnosis not present

## 2018-01-06 DIAGNOSIS — E1122 Type 2 diabetes mellitus with diabetic chronic kidney disease: Secondary | ICD-10-CM | POA: Diagnosis not present

## 2018-01-10 DIAGNOSIS — E7849 Other hyperlipidemia: Secondary | ICD-10-CM | POA: Diagnosis not present

## 2018-01-10 DIAGNOSIS — E119 Type 2 diabetes mellitus without complications: Secondary | ICD-10-CM | POA: Diagnosis not present

## 2018-01-10 DIAGNOSIS — D518 Other vitamin B12 deficiency anemias: Secondary | ICD-10-CM | POA: Diagnosis not present

## 2018-01-10 DIAGNOSIS — E038 Other specified hypothyroidism: Secondary | ICD-10-CM | POA: Diagnosis not present

## 2018-01-10 DIAGNOSIS — Z79899 Other long term (current) drug therapy: Secondary | ICD-10-CM | POA: Diagnosis not present

## 2018-01-14 DIAGNOSIS — R05 Cough: Secondary | ICD-10-CM | POA: Diagnosis not present

## 2018-01-20 DIAGNOSIS — F0391 Unspecified dementia with behavioral disturbance: Secondary | ICD-10-CM | POA: Diagnosis not present

## 2018-01-20 DIAGNOSIS — R05 Cough: Secondary | ICD-10-CM | POA: Diagnosis not present

## 2018-01-24 DIAGNOSIS — F0391 Unspecified dementia with behavioral disturbance: Secondary | ICD-10-CM | POA: Diagnosis not present

## 2018-01-24 DIAGNOSIS — F330 Major depressive disorder, recurrent, mild: Secondary | ICD-10-CM | POA: Diagnosis not present

## 2018-01-24 DIAGNOSIS — R4689 Other symptoms and signs involving appearance and behavior: Secondary | ICD-10-CM | POA: Diagnosis not present

## 2018-02-03 DIAGNOSIS — E1165 Type 2 diabetes mellitus with hyperglycemia: Secondary | ICD-10-CM | POA: Diagnosis not present

## 2018-02-03 DIAGNOSIS — F0390 Unspecified dementia without behavioral disturbance: Secondary | ICD-10-CM | POA: Diagnosis not present

## 2018-02-04 DIAGNOSIS — D518 Other vitamin B12 deficiency anemias: Secondary | ICD-10-CM | POA: Diagnosis not present

## 2018-02-04 DIAGNOSIS — E7849 Other hyperlipidemia: Secondary | ICD-10-CM | POA: Diagnosis not present

## 2018-02-04 DIAGNOSIS — Z79899 Other long term (current) drug therapy: Secondary | ICD-10-CM | POA: Diagnosis not present

## 2018-02-04 DIAGNOSIS — E119 Type 2 diabetes mellitus without complications: Secondary | ICD-10-CM | POA: Diagnosis not present

## 2018-02-07 DIAGNOSIS — R400 Somnolence: Secondary | ICD-10-CM | POA: Diagnosis not present

## 2018-02-07 DIAGNOSIS — R4689 Other symptoms and signs involving appearance and behavior: Secondary | ICD-10-CM | POA: Diagnosis not present

## 2018-02-07 DIAGNOSIS — F0391 Unspecified dementia with behavioral disturbance: Secondary | ICD-10-CM | POA: Diagnosis not present

## 2018-02-07 DIAGNOSIS — F341 Dysthymic disorder: Secondary | ICD-10-CM | POA: Diagnosis not present

## 2018-02-28 DIAGNOSIS — R400 Somnolence: Secondary | ICD-10-CM | POA: Diagnosis not present

## 2018-02-28 DIAGNOSIS — F330 Major depressive disorder, recurrent, mild: Secondary | ICD-10-CM | POA: Diagnosis not present

## 2018-02-28 DIAGNOSIS — F0391 Unspecified dementia with behavioral disturbance: Secondary | ICD-10-CM | POA: Diagnosis not present

## 2018-03-17 DIAGNOSIS — E1122 Type 2 diabetes mellitus with diabetic chronic kidney disease: Secondary | ICD-10-CM | POA: Diagnosis not present

## 2018-03-17 DIAGNOSIS — N183 Chronic kidney disease, stage 3 (moderate): Secondary | ICD-10-CM | POA: Diagnosis not present

## 2018-03-17 DIAGNOSIS — F0391 Unspecified dementia with behavioral disturbance: Secondary | ICD-10-CM | POA: Diagnosis not present

## 2018-03-25 DIAGNOSIS — F330 Major depressive disorder, recurrent, mild: Secondary | ICD-10-CM | POA: Diagnosis not present

## 2018-03-25 DIAGNOSIS — R400 Somnolence: Secondary | ICD-10-CM | POA: Diagnosis not present

## 2018-03-25 DIAGNOSIS — F0390 Unspecified dementia without behavioral disturbance: Secondary | ICD-10-CM | POA: Diagnosis not present

## 2018-04-07 DIAGNOSIS — F0391 Unspecified dementia with behavioral disturbance: Secondary | ICD-10-CM | POA: Diagnosis not present

## 2018-04-07 DIAGNOSIS — N183 Chronic kidney disease, stage 3 (moderate): Secondary | ICD-10-CM | POA: Diagnosis not present

## 2018-04-07 DIAGNOSIS — E039 Hypothyroidism, unspecified: Secondary | ICD-10-CM | POA: Diagnosis not present

## 2018-04-07 DIAGNOSIS — I129 Hypertensive chronic kidney disease with stage 1 through stage 4 chronic kidney disease, or unspecified chronic kidney disease: Secondary | ICD-10-CM | POA: Diagnosis not present

## 2018-04-07 DIAGNOSIS — E1122 Type 2 diabetes mellitus with diabetic chronic kidney disease: Secondary | ICD-10-CM | POA: Diagnosis not present

## 2018-04-21 DIAGNOSIS — R4182 Altered mental status, unspecified: Secondary | ICD-10-CM | POA: Diagnosis not present

## 2018-04-21 DIAGNOSIS — R54 Age-related physical debility: Secondary | ICD-10-CM | POA: Diagnosis not present

## 2018-04-21 DIAGNOSIS — R5381 Other malaise: Secondary | ICD-10-CM | POA: Diagnosis not present

## 2018-04-21 DIAGNOSIS — F0150 Vascular dementia without behavioral disturbance: Secondary | ICD-10-CM | POA: Diagnosis not present

## 2018-04-24 DIAGNOSIS — N390 Urinary tract infection, site not specified: Secondary | ICD-10-CM | POA: Diagnosis not present

## 2018-05-05 DIAGNOSIS — F0391 Unspecified dementia with behavioral disturbance: Secondary | ICD-10-CM | POA: Diagnosis not present

## 2018-05-05 DIAGNOSIS — R54 Age-related physical debility: Secondary | ICD-10-CM | POA: Diagnosis not present

## 2018-05-05 DIAGNOSIS — G4710 Hypersomnia, unspecified: Secondary | ICD-10-CM | POA: Diagnosis not present

## 2018-05-05 DIAGNOSIS — F330 Major depressive disorder, recurrent, mild: Secondary | ICD-10-CM | POA: Diagnosis not present

## 2018-05-24 DIAGNOSIS — L609 Nail disorder, unspecified: Secondary | ICD-10-CM | POA: Diagnosis not present

## 2018-05-24 DIAGNOSIS — E119 Type 2 diabetes mellitus without complications: Secondary | ICD-10-CM | POA: Diagnosis not present

## 2018-06-16 DIAGNOSIS — R05 Cough: Secondary | ICD-10-CM | POA: Diagnosis not present

## 2018-06-16 DIAGNOSIS — F0391 Unspecified dementia with behavioral disturbance: Secondary | ICD-10-CM | POA: Diagnosis not present

## 2018-06-16 DIAGNOSIS — R54 Age-related physical debility: Secondary | ICD-10-CM | POA: Diagnosis not present

## 2018-07-07 DIAGNOSIS — F0391 Unspecified dementia with behavioral disturbance: Secondary | ICD-10-CM | POA: Diagnosis not present

## 2018-07-07 DIAGNOSIS — N183 Chronic kidney disease, stage 3 (moderate): Secondary | ICD-10-CM | POA: Diagnosis not present

## 2018-07-07 DIAGNOSIS — E1122 Type 2 diabetes mellitus with diabetic chronic kidney disease: Secondary | ICD-10-CM | POA: Diagnosis not present

## 2018-07-07 DIAGNOSIS — E039 Hypothyroidism, unspecified: Secondary | ICD-10-CM | POA: Diagnosis not present

## 2018-07-07 DIAGNOSIS — E785 Hyperlipidemia, unspecified: Secondary | ICD-10-CM | POA: Diagnosis not present

## 2018-07-07 DIAGNOSIS — Z6826 Body mass index (BMI) 26.0-26.9, adult: Secondary | ICD-10-CM | POA: Diagnosis not present

## 2018-07-07 DIAGNOSIS — Z23 Encounter for immunization: Secondary | ICD-10-CM | POA: Diagnosis not present

## 2018-07-07 DIAGNOSIS — I129 Hypertensive chronic kidney disease with stage 1 through stage 4 chronic kidney disease, or unspecified chronic kidney disease: Secondary | ICD-10-CM | POA: Diagnosis not present

## 2018-07-08 DIAGNOSIS — Z79899 Other long term (current) drug therapy: Secondary | ICD-10-CM | POA: Diagnosis not present

## 2018-07-08 DIAGNOSIS — E7849 Other hyperlipidemia: Secondary | ICD-10-CM | POA: Diagnosis not present

## 2018-07-13 DIAGNOSIS — F0390 Unspecified dementia without behavioral disturbance: Secondary | ICD-10-CM | POA: Diagnosis not present

## 2018-07-13 DIAGNOSIS — F329 Major depressive disorder, single episode, unspecified: Secondary | ICD-10-CM | POA: Diagnosis not present

## 2018-07-13 DIAGNOSIS — G4700 Insomnia, unspecified: Secondary | ICD-10-CM | POA: Diagnosis not present

## 2018-08-09 DIAGNOSIS — L609 Nail disorder, unspecified: Secondary | ICD-10-CM | POA: Diagnosis not present

## 2018-08-09 DIAGNOSIS — E119 Type 2 diabetes mellitus without complications: Secondary | ICD-10-CM | POA: Diagnosis not present

## 2018-08-25 DIAGNOSIS — F0390 Unspecified dementia without behavioral disturbance: Secondary | ICD-10-CM | POA: Diagnosis not present

## 2018-08-25 DIAGNOSIS — F329 Major depressive disorder, single episode, unspecified: Secondary | ICD-10-CM | POA: Diagnosis not present

## 2018-08-25 DIAGNOSIS — G4700 Insomnia, unspecified: Secondary | ICD-10-CM | POA: Diagnosis not present

## 2018-09-29 DIAGNOSIS — G309 Alzheimer's disease, unspecified: Secondary | ICD-10-CM | POA: Diagnosis not present

## 2018-09-29 DIAGNOSIS — R0989 Other specified symptoms and signs involving the circulatory and respiratory systems: Secondary | ICD-10-CM | POA: Diagnosis not present

## 2018-09-29 DIAGNOSIS — R05 Cough: Secondary | ICD-10-CM | POA: Diagnosis not present

## 2018-10-06 DIAGNOSIS — F0390 Unspecified dementia without behavioral disturbance: Secondary | ICD-10-CM | POA: Diagnosis not present

## 2018-10-06 DIAGNOSIS — I129 Hypertensive chronic kidney disease with stage 1 through stage 4 chronic kidney disease, or unspecified chronic kidney disease: Secondary | ICD-10-CM | POA: Diagnosis not present

## 2018-10-06 DIAGNOSIS — E1122 Type 2 diabetes mellitus with diabetic chronic kidney disease: Secondary | ICD-10-CM | POA: Diagnosis not present

## 2018-10-06 DIAGNOSIS — E039 Hypothyroidism, unspecified: Secondary | ICD-10-CM | POA: Diagnosis not present

## 2018-10-06 DIAGNOSIS — N183 Chronic kidney disease, stage 3 (moderate): Secondary | ICD-10-CM | POA: Diagnosis not present

## 2018-10-14 DIAGNOSIS — E038 Other specified hypothyroidism: Secondary | ICD-10-CM | POA: Diagnosis not present

## 2018-10-14 DIAGNOSIS — E7849 Other hyperlipidemia: Secondary | ICD-10-CM | POA: Diagnosis not present

## 2018-10-14 DIAGNOSIS — E119 Type 2 diabetes mellitus without complications: Secondary | ICD-10-CM | POA: Diagnosis not present

## 2018-10-14 DIAGNOSIS — Z79899 Other long term (current) drug therapy: Secondary | ICD-10-CM | POA: Diagnosis not present

## 2018-10-14 DIAGNOSIS — D518 Other vitamin B12 deficiency anemias: Secondary | ICD-10-CM | POA: Diagnosis not present

## 2018-10-19 DIAGNOSIS — F0390 Unspecified dementia without behavioral disturbance: Secondary | ICD-10-CM | POA: Diagnosis not present

## 2018-10-19 DIAGNOSIS — F329 Major depressive disorder, single episode, unspecified: Secondary | ICD-10-CM | POA: Diagnosis not present

## 2018-10-25 DIAGNOSIS — L609 Nail disorder, unspecified: Secondary | ICD-10-CM | POA: Diagnosis not present

## 2018-10-25 DIAGNOSIS — E119 Type 2 diabetes mellitus without complications: Secondary | ICD-10-CM | POA: Diagnosis not present

## 2018-12-21 DIAGNOSIS — F329 Major depressive disorder, single episode, unspecified: Secondary | ICD-10-CM | POA: Diagnosis not present

## 2018-12-21 DIAGNOSIS — F0390 Unspecified dementia without behavioral disturbance: Secondary | ICD-10-CM | POA: Diagnosis not present

## 2018-12-29 DIAGNOSIS — E1122 Type 2 diabetes mellitus with diabetic chronic kidney disease: Secondary | ICD-10-CM | POA: Diagnosis not present

## 2018-12-29 DIAGNOSIS — N183 Chronic kidney disease, stage 3 (moderate): Secondary | ICD-10-CM | POA: Diagnosis not present

## 2018-12-29 DIAGNOSIS — F0390 Unspecified dementia without behavioral disturbance: Secondary | ICD-10-CM | POA: Diagnosis not present

## 2018-12-29 DIAGNOSIS — E039 Hypothyroidism, unspecified: Secondary | ICD-10-CM | POA: Diagnosis not present

## 2019-01-11 DIAGNOSIS — F0390 Unspecified dementia without behavioral disturbance: Secondary | ICD-10-CM | POA: Diagnosis not present

## 2019-01-11 DIAGNOSIS — F528 Other sexual dysfunction not due to a substance or known physiological condition: Secondary | ICD-10-CM | POA: Diagnosis not present

## 2019-02-22 DIAGNOSIS — F528 Other sexual dysfunction not due to a substance or known physiological condition: Secondary | ICD-10-CM | POA: Diagnosis not present

## 2019-02-22 DIAGNOSIS — F0390 Unspecified dementia without behavioral disturbance: Secondary | ICD-10-CM | POA: Diagnosis not present

## 2019-03-21 DIAGNOSIS — E119 Type 2 diabetes mellitus without complications: Secondary | ICD-10-CM | POA: Diagnosis not present

## 2019-03-21 DIAGNOSIS — L609 Nail disorder, unspecified: Secondary | ICD-10-CM | POA: Diagnosis not present

## 2019-03-30 DIAGNOSIS — F0390 Unspecified dementia without behavioral disturbance: Secondary | ICD-10-CM | POA: Diagnosis not present

## 2019-03-30 DIAGNOSIS — H1132 Conjunctival hemorrhage, left eye: Secondary | ICD-10-CM | POA: Diagnosis not present

## 2019-04-13 DIAGNOSIS — F528 Other sexual dysfunction not due to a substance or known physiological condition: Secondary | ICD-10-CM | POA: Diagnosis not present

## 2019-04-20 DIAGNOSIS — I129 Hypertensive chronic kidney disease with stage 1 through stage 4 chronic kidney disease, or unspecified chronic kidney disease: Secondary | ICD-10-CM | POA: Diagnosis not present

## 2019-04-20 DIAGNOSIS — F0391 Unspecified dementia with behavioral disturbance: Secondary | ICD-10-CM | POA: Diagnosis not present

## 2019-04-20 DIAGNOSIS — E1122 Type 2 diabetes mellitus with diabetic chronic kidney disease: Secondary | ICD-10-CM | POA: Diagnosis not present

## 2019-04-20 DIAGNOSIS — I2510 Atherosclerotic heart disease of native coronary artery without angina pectoris: Secondary | ICD-10-CM | POA: Diagnosis not present

## 2019-04-20 DIAGNOSIS — N183 Chronic kidney disease, stage 3 (moderate): Secondary | ICD-10-CM | POA: Diagnosis not present

## 2019-04-21 DIAGNOSIS — E7849 Other hyperlipidemia: Secondary | ICD-10-CM | POA: Diagnosis not present

## 2019-04-21 DIAGNOSIS — E119 Type 2 diabetes mellitus without complications: Secondary | ICD-10-CM | POA: Diagnosis not present

## 2019-04-21 DIAGNOSIS — E559 Vitamin D deficiency, unspecified: Secondary | ICD-10-CM | POA: Diagnosis not present

## 2019-04-21 DIAGNOSIS — D518 Other vitamin B12 deficiency anemias: Secondary | ICD-10-CM | POA: Diagnosis not present

## 2019-04-21 DIAGNOSIS — E038 Other specified hypothyroidism: Secondary | ICD-10-CM | POA: Diagnosis not present

## 2019-04-21 DIAGNOSIS — Z79899 Other long term (current) drug therapy: Secondary | ICD-10-CM | POA: Diagnosis not present

## 2019-04-27 DIAGNOSIS — F0390 Unspecified dementia without behavioral disturbance: Secondary | ICD-10-CM | POA: Diagnosis not present

## 2019-04-27 DIAGNOSIS — N183 Chronic kidney disease, stage 3 (moderate): Secondary | ICD-10-CM | POA: Diagnosis not present

## 2019-04-27 DIAGNOSIS — E1122 Type 2 diabetes mellitus with diabetic chronic kidney disease: Secondary | ICD-10-CM | POA: Diagnosis not present

## 2019-04-27 DIAGNOSIS — R799 Abnormal finding of blood chemistry, unspecified: Secondary | ICD-10-CM | POA: Diagnosis not present

## 2019-04-27 DIAGNOSIS — E875 Hyperkalemia: Secondary | ICD-10-CM | POA: Diagnosis not present

## 2019-06-13 DIAGNOSIS — L609 Nail disorder, unspecified: Secondary | ICD-10-CM | POA: Diagnosis not present

## 2019-06-13 DIAGNOSIS — E119 Type 2 diabetes mellitus without complications: Secondary | ICD-10-CM | POA: Diagnosis not present

## 2019-06-28 DIAGNOSIS — Z20828 Contact with and (suspected) exposure to other viral communicable diseases: Secondary | ICD-10-CM | POA: Diagnosis not present

## 2019-06-28 DIAGNOSIS — U071 COVID-19: Secondary | ICD-10-CM | POA: Diagnosis not present

## 2019-07-20 DIAGNOSIS — S8012XA Contusion of left lower leg, initial encounter: Secondary | ICD-10-CM | POA: Diagnosis not present

## 2019-07-20 DIAGNOSIS — R269 Unspecified abnormalities of gait and mobility: Secondary | ICD-10-CM | POA: Diagnosis not present

## 2019-07-20 DIAGNOSIS — M6281 Muscle weakness (generalized): Secondary | ICD-10-CM | POA: Diagnosis not present

## 2019-07-20 DIAGNOSIS — Z9181 History of falling: Secondary | ICD-10-CM | POA: Diagnosis not present

## 2019-07-20 DIAGNOSIS — F0390 Unspecified dementia without behavioral disturbance: Secondary | ICD-10-CM | POA: Diagnosis not present

## 2019-07-20 DIAGNOSIS — W06XXXA Fall from bed, initial encounter: Secondary | ICD-10-CM | POA: Diagnosis not present

## 2019-08-02 DIAGNOSIS — U071 COVID-19: Secondary | ICD-10-CM | POA: Diagnosis not present

## 2019-08-03 DIAGNOSIS — R0989 Other specified symptoms and signs involving the circulatory and respiratory systems: Secondary | ICD-10-CM | POA: Diagnosis not present

## 2019-08-03 DIAGNOSIS — Z87440 Personal history of urinary (tract) infections: Secondary | ICD-10-CM | POA: Diagnosis not present

## 2019-08-03 DIAGNOSIS — F0390 Unspecified dementia without behavioral disturbance: Secondary | ICD-10-CM | POA: Diagnosis not present

## 2019-08-03 DIAGNOSIS — R4182 Altered mental status, unspecified: Secondary | ICD-10-CM | POA: Diagnosis not present

## 2019-08-10 DIAGNOSIS — I129 Hypertensive chronic kidney disease with stage 1 through stage 4 chronic kidney disease, or unspecified chronic kidney disease: Secondary | ICD-10-CM | POA: Diagnosis not present

## 2019-08-10 DIAGNOSIS — I2510 Atherosclerotic heart disease of native coronary artery without angina pectoris: Secondary | ICD-10-CM | POA: Diagnosis not present

## 2019-08-10 DIAGNOSIS — E1122 Type 2 diabetes mellitus with diabetic chronic kidney disease: Secondary | ICD-10-CM | POA: Diagnosis not present

## 2019-08-10 DIAGNOSIS — N183 Chronic kidney disease, stage 3 unspecified: Secondary | ICD-10-CM | POA: Diagnosis not present

## 2019-08-10 DIAGNOSIS — E039 Hypothyroidism, unspecified: Secondary | ICD-10-CM | POA: Diagnosis not present

## 2019-08-10 DIAGNOSIS — F0390 Unspecified dementia without behavioral disturbance: Secondary | ICD-10-CM | POA: Diagnosis not present

## 2019-08-10 DIAGNOSIS — Z6826 Body mass index (BMI) 26.0-26.9, adult: Secondary | ICD-10-CM | POA: Diagnosis not present

## 2019-08-15 DIAGNOSIS — E119 Type 2 diabetes mellitus without complications: Secondary | ICD-10-CM | POA: Diagnosis not present

## 2019-08-15 DIAGNOSIS — D518 Other vitamin B12 deficiency anemias: Secondary | ICD-10-CM | POA: Diagnosis not present

## 2019-08-15 DIAGNOSIS — E559 Vitamin D deficiency, unspecified: Secondary | ICD-10-CM | POA: Diagnosis not present

## 2019-08-15 DIAGNOSIS — E7849 Other hyperlipidemia: Secondary | ICD-10-CM | POA: Diagnosis not present

## 2019-08-15 DIAGNOSIS — Z79899 Other long term (current) drug therapy: Secondary | ICD-10-CM | POA: Diagnosis not present

## 2019-08-15 DIAGNOSIS — E038 Other specified hypothyroidism: Secondary | ICD-10-CM | POA: Diagnosis not present

## 2019-08-16 DIAGNOSIS — F0390 Unspecified dementia without behavioral disturbance: Secondary | ICD-10-CM | POA: Diagnosis not present

## 2019-09-05 DIAGNOSIS — N390 Urinary tract infection, site not specified: Secondary | ICD-10-CM | POA: Diagnosis not present

## 2019-10-03 DIAGNOSIS — E119 Type 2 diabetes mellitus without complications: Secondary | ICD-10-CM | POA: Diagnosis not present

## 2019-10-03 DIAGNOSIS — L609 Nail disorder, unspecified: Secondary | ICD-10-CM | POA: Diagnosis not present

## 2019-11-13 DEATH — deceased

## 2020-03-19 ENCOUNTER — Encounter: Payer: Self-pay | Admitting: Gastroenterology
